# Patient Record
Sex: Female | Born: 1950 | Race: Black or African American | Hispanic: No | Smoking: Current every day smoker
Health system: Southern US, Community
[De-identification: ages and names within clinical notes are randomized; demographics above are authoritative.]

## PROBLEM LIST (undated history)

## (undated) DIAGNOSIS — M1990 Unspecified osteoarthritis, unspecified site: Secondary | ICD-10-CM

## (undated) DIAGNOSIS — R202 Paresthesia of skin: Secondary | ICD-10-CM

## (undated) DIAGNOSIS — I10 Essential (primary) hypertension: Secondary | ICD-10-CM

## (undated) HISTORY — PX: COLONOSCOPY: SHX174

## (undated) HISTORY — PX: BREAST SURGERY: SHX581

---

## 2001-07-08 ENCOUNTER — Encounter: Payer: Self-pay | Admitting: *Deleted

## 2001-07-08 ENCOUNTER — Other Ambulatory Visit: Admission: RE | Admit: 2001-07-08 | Discharge: 2001-07-08 | Payer: Self-pay | Admitting: *Deleted

## 2001-07-08 ENCOUNTER — Ambulatory Visit (HOSPITAL_COMMUNITY): Admission: RE | Admit: 2001-07-08 | Discharge: 2001-07-08 | Payer: Self-pay | Admitting: *Deleted

## 2001-07-09 ENCOUNTER — Ambulatory Visit (HOSPITAL_COMMUNITY): Admission: RE | Admit: 2001-07-09 | Discharge: 2001-07-09 | Payer: Self-pay | Admitting: *Deleted

## 2001-07-09 ENCOUNTER — Encounter: Payer: Self-pay | Admitting: *Deleted

## 2002-07-15 ENCOUNTER — Encounter: Payer: Self-pay | Admitting: Family Medicine

## 2002-07-15 ENCOUNTER — Ambulatory Visit (HOSPITAL_COMMUNITY): Admission: RE | Admit: 2002-07-15 | Discharge: 2002-07-15 | Payer: Self-pay | Admitting: Family Medicine

## 2003-08-01 ENCOUNTER — Ambulatory Visit (HOSPITAL_COMMUNITY): Admission: RE | Admit: 2003-08-01 | Discharge: 2003-08-01 | Payer: Self-pay | Admitting: Family Medicine

## 2003-08-01 ENCOUNTER — Encounter: Payer: Self-pay | Admitting: Family Medicine

## 2004-09-20 ENCOUNTER — Ambulatory Visit (HOSPITAL_COMMUNITY): Admission: RE | Admit: 2004-09-20 | Discharge: 2004-09-20 | Payer: Self-pay | Admitting: Family Medicine

## 2004-10-10 ENCOUNTER — Ambulatory Visit (HOSPITAL_COMMUNITY): Admission: RE | Admit: 2004-10-10 | Discharge: 2004-10-10 | Payer: Self-pay | Admitting: Family Medicine

## 2004-10-11 ENCOUNTER — Ambulatory Visit (HOSPITAL_COMMUNITY): Admission: RE | Admit: 2004-10-11 | Discharge: 2004-10-11 | Payer: Self-pay | Admitting: Family Medicine

## 2005-09-26 ENCOUNTER — Ambulatory Visit (HOSPITAL_COMMUNITY): Admission: RE | Admit: 2005-09-26 | Discharge: 2005-09-26 | Payer: Self-pay | Admitting: Family Medicine

## 2005-10-22 ENCOUNTER — Ambulatory Visit (HOSPITAL_COMMUNITY): Admission: RE | Admit: 2005-10-22 | Discharge: 2005-10-22 | Payer: Self-pay | Admitting: Family Medicine

## 2005-11-19 ENCOUNTER — Ambulatory Visit (HOSPITAL_COMMUNITY): Admission: RE | Admit: 2005-11-19 | Discharge: 2005-11-19 | Payer: Self-pay | Admitting: General Surgery

## 2005-11-19 ENCOUNTER — Encounter (INDEPENDENT_AMBULATORY_CARE_PROVIDER_SITE_OTHER): Payer: Self-pay | Admitting: General Surgery

## 2006-10-07 ENCOUNTER — Ambulatory Visit (HOSPITAL_COMMUNITY): Admission: RE | Admit: 2006-10-07 | Discharge: 2006-10-07 | Payer: Self-pay | Admitting: Family Medicine

## 2006-11-19 ENCOUNTER — Ambulatory Visit (HOSPITAL_COMMUNITY): Admission: RE | Admit: 2006-11-19 | Discharge: 2006-11-19 | Payer: Self-pay | Admitting: Family Medicine

## 2007-10-23 ENCOUNTER — Ambulatory Visit (HOSPITAL_COMMUNITY): Admission: RE | Admit: 2007-10-23 | Discharge: 2007-10-23 | Payer: Self-pay | Admitting: Family Medicine

## 2008-10-25 ENCOUNTER — Ambulatory Visit (HOSPITAL_COMMUNITY): Admission: RE | Admit: 2008-10-25 | Discharge: 2008-10-25 | Payer: Self-pay | Admitting: Family Medicine

## 2009-10-30 ENCOUNTER — Ambulatory Visit (HOSPITAL_COMMUNITY): Admission: RE | Admit: 2009-10-30 | Discharge: 2009-10-30 | Payer: Self-pay | Admitting: Family Medicine

## 2010-10-19 ENCOUNTER — Ambulatory Visit (HOSPITAL_COMMUNITY): Admission: RE | Admit: 2010-10-19 | Discharge: 2010-10-19 | Payer: Self-pay | Admitting: Family Medicine

## 2010-11-01 ENCOUNTER — Ambulatory Visit (HOSPITAL_COMMUNITY): Admission: RE | Admit: 2010-11-01 | Discharge: 2010-11-01 | Payer: Self-pay | Admitting: Family Medicine

## 2011-04-09 ENCOUNTER — Other Ambulatory Visit (HOSPITAL_COMMUNITY): Payer: Self-pay | Admitting: Family Medicine

## 2011-04-09 DIAGNOSIS — F17200 Nicotine dependence, unspecified, uncomplicated: Secondary | ICD-10-CM

## 2011-04-09 DIAGNOSIS — R350 Frequency of micturition: Secondary | ICD-10-CM

## 2011-04-09 DIAGNOSIS — R634 Abnormal weight loss: Secondary | ICD-10-CM

## 2011-04-10 ENCOUNTER — Ambulatory Visit (HOSPITAL_COMMUNITY)
Admission: RE | Admit: 2011-04-10 | Discharge: 2011-04-10 | Disposition: A | Discharge status: Home / Self Care | Payer: Medicare Other | Source: Ambulatory Visit | Attending: Family Medicine | Admitting: Family Medicine

## 2011-04-10 DIAGNOSIS — N9489 Other specified conditions associated with female genital organs and menstrual cycle: Secondary | ICD-10-CM | POA: Insufficient documentation

## 2011-04-10 DIAGNOSIS — R05 Cough: Secondary | ICD-10-CM | POA: Insufficient documentation

## 2011-04-10 DIAGNOSIS — M51379 Other intervertebral disc degeneration, lumbosacral region without mention of lumbar back pain or lower extremity pain: Secondary | ICD-10-CM | POA: Insufficient documentation

## 2011-04-10 DIAGNOSIS — N390 Urinary tract infection, site not specified: Secondary | ICD-10-CM | POA: Insufficient documentation

## 2011-04-10 DIAGNOSIS — R059 Cough, unspecified: Secondary | ICD-10-CM | POA: Insufficient documentation

## 2011-04-10 DIAGNOSIS — I700 Atherosclerosis of aorta: Secondary | ICD-10-CM | POA: Insufficient documentation

## 2011-04-10 DIAGNOSIS — R634 Abnormal weight loss: Secondary | ICD-10-CM | POA: Insufficient documentation

## 2011-04-10 DIAGNOSIS — F17200 Nicotine dependence, unspecified, uncomplicated: Secondary | ICD-10-CM | POA: Insufficient documentation

## 2011-04-10 DIAGNOSIS — K4090 Unilateral inguinal hernia, without obstruction or gangrene, not specified as recurrent: Secondary | ICD-10-CM | POA: Insufficient documentation

## 2011-04-10 DIAGNOSIS — M5137 Other intervertebral disc degeneration, lumbosacral region: Secondary | ICD-10-CM | POA: Insufficient documentation

## 2011-04-10 DIAGNOSIS — K5730 Diverticulosis of large intestine without perforation or abscess without bleeding: Secondary | ICD-10-CM | POA: Insufficient documentation

## 2011-04-10 DIAGNOSIS — R350 Frequency of micturition: Secondary | ICD-10-CM

## 2011-04-10 MED ORDER — IOHEXOL 300 MG/ML  SOLN
100.0000 mL | Freq: Once | INTRAMUSCULAR | Status: AC | PRN
  Administered 2011-04-10: 100 mL via INTRAVENOUS

## 2011-05-03 NOTE — H&P (Signed)
Isabel Woods, Isabel Woods                ACCOUNT NO.:  [id number]   MEDICAL RECORD NO.:  [id number]          PATIENT TYPE:  AMB   LOCATION:                                FACILITY:  [facility]   PHYSICIAN:  Dalia Heading, M.D.  DATE OF BIRTH:  1951-06-24   DATE OF ADMISSION:  11/19/2005  DATE OF DISCHARGE:  LH                                HISTORY & PHYSICAL   CHIEF COMPLAINT:  Need for screening colonoscopy.   HISTORY OF PRESENT ILLNESS:  The patient is a 60 year old black female who  is referred for endoscopic evaluation.  She needs a colonoscopy for  screening purposes.  No abdominal pain, weight loss, nausea, vomiting,  diarrhea, constipation, melena, hematochezia.  She has never had a  colonoscopy.  There is no family history of colon carcinoma.   PAST MEDICAL HISTORY:  1.  Arthritis.  2.  Hypertension.   PAST SURGICAL HISTORY:  Unremarkable.   CURRENT MEDICATIONS:  1.  Norvasc.  2.  Benicar.  3.  Metoprolol.  4.  Triamterene.  5.  Prednisone.  6.  Zyrtec.  7.  Remicade.   ALLERGIES:  Sulfa.   REVIEW OF SYSTEMS:  The patient smokes a pack of cigarettes a day.  She  denies any alcohol use.   PHYSICAL EXAMINATION:  GENERAL:  The patient is a well-developed, well-  nourished, black female in no acute distress.  LUNGS:  Clear to auscultation with equal breath sounds bilaterally.  HEART:  Regular rate and rhythm without S3, S4, or murmurs.  ABDOMEN:  Soft, nontender, nondistended.  No hepatosplenomegaly or masses  are noted.  RECTAL:  Deferred to the procedure.   IMPRESSION:  Need for screening colonoscopy.   PLAN:  The patient is scheduled for a colonoscopy on November 19, 2005.  The  risks and benefits of the procedure including bleeding and perforation were  fully explained to the patient who gave informed consent.      Dalia Heading, M.D.  Electronically Signed     MAJ/MEDQ  D:  10/31/2005  T:  10/31/2005  Job:  [id number]   cc:   Patrica Duel, M.D.  Fax:  [phone]

## 2011-10-09 ENCOUNTER — Other Ambulatory Visit (HOSPITAL_COMMUNITY): Payer: Self-pay | Admitting: Family Medicine

## 2011-10-09 DIAGNOSIS — Z139 Encounter for screening, unspecified: Secondary | ICD-10-CM

## 2011-10-29 ENCOUNTER — Other Ambulatory Visit (HOSPITAL_COMMUNITY): Payer: Self-pay | Admitting: Family Medicine

## 2011-10-29 ENCOUNTER — Ambulatory Visit (HOSPITAL_COMMUNITY)
Admission: RE | Admit: 2011-10-29 | Discharge: 2011-10-29 | Disposition: A | Discharge status: Home / Self Care | Payer: Medicare Other | Source: Ambulatory Visit | Attending: Family Medicine | Admitting: Family Medicine

## 2011-10-29 DIAGNOSIS — I10 Essential (primary) hypertension: Secondary | ICD-10-CM

## 2011-10-29 DIAGNOSIS — Z01419 Encounter for gynecological examination (general) (routine) without abnormal findings: Secondary | ICD-10-CM

## 2011-10-29 DIAGNOSIS — M069 Rheumatoid arthritis, unspecified: Secondary | ICD-10-CM | POA: Insufficient documentation

## 2011-11-04 ENCOUNTER — Ambulatory Visit (HOSPITAL_COMMUNITY)
Admission: RE | Admit: 2011-11-04 | Discharge: 2011-11-04 | Disposition: A | Discharge status: Home / Self Care | Payer: Medicare Other | Source: Ambulatory Visit | Attending: Family Medicine | Admitting: Family Medicine

## 2011-11-04 DIAGNOSIS — Z139 Encounter for screening, unspecified: Secondary | ICD-10-CM

## 2011-11-04 DIAGNOSIS — Z1231 Encounter for screening mammogram for malignant neoplasm of breast: Secondary | ICD-10-CM | POA: Insufficient documentation

## 2011-11-14 ENCOUNTER — Other Ambulatory Visit: Payer: Self-pay | Admitting: Family Medicine

## 2011-11-14 DIAGNOSIS — R928 Other abnormal and inconclusive findings on diagnostic imaging of breast: Secondary | ICD-10-CM

## 2011-12-04 ENCOUNTER — Other Ambulatory Visit: Payer: Self-pay | Admitting: Family Medicine

## 2011-12-04 ENCOUNTER — Ambulatory Visit (HOSPITAL_COMMUNITY)
Admission: RE | Admit: 2011-12-04 | Discharge: 2011-12-04 | Disposition: A | Discharge status: Home / Self Care | Payer: Medicare Other | Source: Ambulatory Visit | Attending: Family Medicine | Admitting: Family Medicine

## 2011-12-04 DIAGNOSIS — R928 Other abnormal and inconclusive findings on diagnostic imaging of breast: Secondary | ICD-10-CM | POA: Insufficient documentation

## 2011-12-04 DIAGNOSIS — R921 Mammographic calcification found on diagnostic imaging of breast: Secondary | ICD-10-CM

## 2011-12-16 ENCOUNTER — Other Ambulatory Visit: Payer: Self-pay | Admitting: Family Medicine

## 2011-12-16 ENCOUNTER — Ambulatory Visit
Admission: RE | Admit: 2011-12-16 | Discharge: 2011-12-16 | Disposition: A | Discharge status: Home / Self Care | Payer: Medicare Other | Source: Ambulatory Visit | Attending: Family Medicine | Admitting: Family Medicine

## 2011-12-16 DIAGNOSIS — R921 Mammographic calcification found on diagnostic imaging of breast: Secondary | ICD-10-CM

## 2011-12-18 ENCOUNTER — Ambulatory Visit
Admission: RE | Admit: 2011-12-18 | Discharge: 2011-12-18 | Disposition: A | Discharge status: Home / Self Care | Payer: Medicare Other | Source: Ambulatory Visit | Attending: Family Medicine | Admitting: Family Medicine

## 2011-12-18 DIAGNOSIS — R921 Mammographic calcification found on diagnostic imaging of breast: Secondary | ICD-10-CM

## 2012-01-21 ENCOUNTER — Other Ambulatory Visit (HOSPITAL_COMMUNITY): Payer: Self-pay | Admitting: Family Medicine

## 2012-01-21 ENCOUNTER — Ambulatory Visit (HOSPITAL_COMMUNITY)
Admission: RE | Admit: 2012-01-21 | Discharge: 2012-01-21 | Disposition: A | Discharge status: Home / Self Care | Payer: Medicare Other | Source: Ambulatory Visit | Attending: Family Medicine | Admitting: Family Medicine

## 2012-01-21 DIAGNOSIS — M5030 Other cervical disc degeneration, unspecified cervical region: Secondary | ICD-10-CM

## 2012-01-21 DIAGNOSIS — M47812 Spondylosis without myelopathy or radiculopathy, cervical region: Secondary | ICD-10-CM | POA: Insufficient documentation

## 2012-01-21 DIAGNOSIS — M542 Cervicalgia: Secondary | ICD-10-CM

## 2012-01-23 ENCOUNTER — Ambulatory Visit (HOSPITAL_COMMUNITY)
Admission: RE | Admit: 2012-01-23 | Discharge: 2012-01-23 | Disposition: A | Discharge status: Home / Self Care | Payer: Medicare Other | Source: Ambulatory Visit | Attending: Family Medicine | Admitting: Family Medicine

## 2012-01-23 DIAGNOSIS — M5020 Other cervical disc displacement, unspecified cervical region: Secondary | ICD-10-CM | POA: Insufficient documentation

## 2012-01-23 DIAGNOSIS — M542 Cervicalgia: Secondary | ICD-10-CM

## 2012-01-23 DIAGNOSIS — M5030 Other cervical disc degeneration, unspecified cervical region: Secondary | ICD-10-CM | POA: Insufficient documentation

## 2012-02-12 ENCOUNTER — Encounter (HOSPITAL_COMMUNITY): Payer: Self-pay | Admitting: Pharmacy Technician

## 2012-02-19 ENCOUNTER — Encounter (HOSPITAL_COMMUNITY)
Admission: RE | Admit: 2012-02-19 | Discharge: 2012-02-19 | Disposition: A | Discharge status: Home / Self Care | Payer: Medicare Other | Source: Ambulatory Visit | Attending: Anesthesiology | Admitting: Anesthesiology

## 2012-02-19 ENCOUNTER — Encounter (HOSPITAL_COMMUNITY)
Admission: RE | Admit: 2012-02-19 | Discharge: 2012-02-19 | Disposition: A | Discharge status: Home / Self Care | Payer: Medicare Other | Source: Ambulatory Visit | Attending: Neurosurgery | Admitting: Neurosurgery

## 2012-02-19 ENCOUNTER — Encounter (HOSPITAL_COMMUNITY): Payer: Self-pay

## 2012-02-19 ENCOUNTER — Other Ambulatory Visit: Payer: Self-pay

## 2012-02-19 HISTORY — DX: Paresthesia of skin: R20.2

## 2012-02-19 HISTORY — DX: Unspecified osteoarthritis, unspecified site: M19.90

## 2012-02-19 HISTORY — DX: Essential (primary) hypertension: I10

## 2012-02-19 LAB — SURGICAL PCR SCREEN
MRSA, PCR: NEGATIVE
Staphylococcus aureus: NEGATIVE

## 2012-02-19 LAB — BASIC METABOLIC PANEL
CO2: 26 mEq/L (ref 19–32)
Calcium: 9.1 mg/dL (ref 8.4–10.5)
Creatinine, Ser: 0.76 mg/dL (ref 0.50–1.10)
GFR calc non Af Amer: 89 mL/min — ABNORMAL LOW (ref >90)

## 2012-02-19 LAB — CBC
MCV: 87.1 fL (ref 78.0–100.0)
Platelets: 318 10*3/uL (ref 150–400)
RDW: 14.5 % (ref 11.5–15.5)
WBC: 9.9 10*3/uL (ref 4.0–10.5)

## 2012-02-19 NOTE — Pre-Procedure Instructions (Signed)
20 Isabel Woods  02/19/2012   Your procedure is scheduled on:  MARCH 12  Report to [facility] at 0800 AM.  Call this number if you have problems the morning of surgery: [phone]   Remember:   Do not eat food:After Midnight.  May have clear liquids: up to 4 Hours before arrival.0400  Clear liquids include soda, tea, black coffee, apple or grape juice, broth.  Take these medicines the morning of surgery with A SIP OF WATER: METOPROLOL   Do not wear jewelry, make-up or nail polish.  Do not wear lotions, powders, or perfumes. You may wear deodorant.  Do not shave 48 hours prior to surgery.  Do not bring valuables to the hospital.  Contacts, dentures or bridgework may not be worn into surgery.  Leave suitcase in the car. After surgery it may be brought to your room.  For patients admitted to the hospital, checkout time is 11:00 AM the day of discharge.   Patients discharged the day of surgery will not be allowed to drive home.  Name and phone number of your driver: FAMILY  Special Instructions: CHG Shower Use Special Wash: 1/2 bottle night before surgery and 1/2 bottle morning of surgery.   Please read over the following fact sheets that you were given: MRSA Information and Surgical Site Infection Prevention

## 2012-02-19 NOTE — Progress Notes (Signed)
CALLED FOR ORDERS 

## 2012-02-20 NOTE — Progress Notes (Signed)
Office notified that no orders have been received at this time.//L. Nicosha Struve,RN

## 2012-02-25 ENCOUNTER — Ambulatory Visit (HOSPITAL_COMMUNITY): Payer: Medicare Other | Admitting: Anesthesiology

## 2012-02-25 ENCOUNTER — Encounter (HOSPITAL_COMMUNITY): Payer: Self-pay | Admitting: Surgery

## 2012-02-25 ENCOUNTER — Encounter (HOSPITAL_COMMUNITY)
Admission: RE | Disposition: A | Discharge status: Home / Self Care | Payer: Self-pay | Source: Ambulatory Visit | Attending: Neurosurgery

## 2012-02-25 ENCOUNTER — Encounter (HOSPITAL_COMMUNITY): Payer: Self-pay | Admitting: Anesthesiology

## 2012-02-25 ENCOUNTER — Ambulatory Visit (HOSPITAL_COMMUNITY): Payer: Medicare Other

## 2012-02-25 ENCOUNTER — Inpatient Hospital Stay (HOSPITAL_COMMUNITY)
Admission: RE | Admit: 2012-02-25 | Discharge: 2012-02-25 | DRG: 473 | Disposition: A | Discharge status: Home / Self Care | Payer: Medicare Other | Source: Ambulatory Visit | Attending: Neurosurgery | Admitting: Neurosurgery

## 2012-02-25 DIAGNOSIS — Z01812 Encounter for preprocedural laboratory examination: Secondary | ICD-10-CM

## 2012-02-25 DIAGNOSIS — M5020 Other cervical disc displacement, unspecified cervical region: Secondary | ICD-10-CM

## 2012-02-25 DIAGNOSIS — M069 Rheumatoid arthritis, unspecified: Secondary | ICD-10-CM | POA: Diagnosis present

## 2012-02-25 DIAGNOSIS — I10 Essential (primary) hypertension: Secondary | ICD-10-CM | POA: Diagnosis present

## 2012-02-25 DIAGNOSIS — F17200 Nicotine dependence, unspecified, uncomplicated: Secondary | ICD-10-CM | POA: Diagnosis present

## 2012-02-25 HISTORY — PX: ANTERIOR CERVICAL DECOMP/DISCECTOMY FUSION: SHX1161

## 2012-02-25 SURGERY — ANTERIOR CERVICAL DECOMPRESSION/DISCECTOMY FUSION 2 LEVELS
Anesthesia: General | Wound class: Clean

## 2012-02-25 MED ORDER — LACTATED RINGERS IV SOLN
INTRAVENOUS | Status: DC | PRN
  Administered 2012-02-25 (×2): via INTRAVENOUS

## 2012-02-25 MED ORDER — AMLODIPINE BESYLATE 10 MG PO TABS
10.0000 mg | ORAL_TABLET | Freq: Every day | ORAL | Status: DC
  Filled 2012-02-25: qty 1

## 2012-02-25 MED ORDER — LEFLUNOMIDE 20 MG PO TABS
20.0000 mg | ORAL_TABLET | Freq: Every day | ORAL | Status: DC
  Filled 2012-02-25: qty 1

## 2012-02-25 MED ORDER — LOSARTAN POTASSIUM 50 MG PO TABS
100.0000 mg | ORAL_TABLET | Freq: Every day | ORAL | Status: DC
  Filled 2012-02-25: qty 2

## 2012-02-25 MED ORDER — HYDROCODONE-ACETAMINOPHEN 5-325 MG PO TABS: 1.0000 | ORAL_TABLET | ORAL | Status: AC | PRN

## 2012-02-25 MED ORDER — HYDROCHLOROTHIAZIDE 12.5 MG PO CAPS
12.5000 mg | ORAL_CAPSULE | Freq: Every day | ORAL | Status: DC
  Filled 2012-02-25: qty 1

## 2012-02-25 MED ORDER — ONDANSETRON HCL 4 MG/2ML IJ SOLN: 4.0000 mg | Freq: Once | INTRAMUSCULAR | Status: DC | PRN

## 2012-02-25 MED ORDER — THROMBIN 5000 UNITS EX SOLR
CUTANEOUS | Status: DC | PRN
  Administered 2012-02-25 (×2): 5000 [IU] via TOPICAL

## 2012-02-25 MED ORDER — HYDROCODONE-ACETAMINOPHEN 5-325 MG PO TABS: 1.0000 | ORAL_TABLET | ORAL | Status: DC | PRN

## 2012-02-25 MED ORDER — HYDROMORPHONE HCL PF 1 MG/ML IJ SOLN
0.2500 mg | INTRAMUSCULAR | Status: DC | PRN
  Administered 2012-02-25 (×2): 0.5 mg via INTRAVENOUS

## 2012-02-25 MED ORDER — HEMOSTATIC AGENTS (NO CHARGE) OPTIME
TOPICAL | Status: DC | PRN
  Administered 2012-02-25: 1 via TOPICAL

## 2012-02-25 MED ORDER — SODIUM CHLORIDE 0.9 % IR SOLN
Status: DC | PRN
  Administered 2012-02-25: 08:00:00

## 2012-02-25 MED ORDER — DIPHENHYDRAMINE HCL 50 MG/ML IJ SOLN
50.0000 mg | Freq: Once | INTRAMUSCULAR | Status: DC
  Filled 2012-02-25: qty 1

## 2012-02-25 MED ORDER — GLYCOPYRROLATE 0.2 MG/ML IJ SOLN
INTRAMUSCULAR | Status: DC | PRN
  Administered 2012-02-25: .8 mg via INTRAVENOUS

## 2012-02-25 MED ORDER — KCL IN DEXTROSE-NACL 20-5-0.45 MEQ/L-%-% IV SOLN
80.0000 mL/h | INTRAVENOUS | Status: DC
  Filled 2012-02-25 (×2): qty 1000

## 2012-02-25 MED ORDER — HYDROMORPHONE HCL PF 1 MG/ML IJ SOLN
INTRAMUSCULAR | Status: AC
  Filled 2012-02-25: qty 1

## 2012-02-25 MED ORDER — MENTHOL 3 MG MT LOZG: 1.0000 | LOZENGE | OROMUCOSAL | Status: DC | PRN

## 2012-02-25 MED ORDER — HYDROMORPHONE HCL PF 1 MG/ML IJ SOLN: 1.0000 mg | INTRAMUSCULAR | Status: DC | PRN

## 2012-02-25 MED ORDER — BACITRACIN 50000 UNITS IM SOLR
INTRAMUSCULAR | Status: AC
  Filled 2012-02-25: qty 1

## 2012-02-25 MED ORDER — LORATADINE 10 MG PO TABS: 10.0000 mg | ORAL_TABLET | Freq: Every day | ORAL | Status: DC

## 2012-02-25 MED ORDER — CYCLOBENZAPRINE HCL 10 MG PO TABS: 10.0000 mg | ORAL_TABLET | Freq: Three times a day (TID) | ORAL | Status: DC | PRN

## 2012-02-25 MED ORDER — SODIUM CHLORIDE 0.9 % IV SOLN
INTRAVENOUS | Status: AC
  Filled 2012-02-25: qty 500

## 2012-02-25 MED ORDER — ONDANSETRON HCL 4 MG/2ML IJ SOLN
INTRAMUSCULAR | Status: DC | PRN
  Administered 2012-02-25: 4 mg via INTRAVENOUS

## 2012-02-25 MED ORDER — 0.9 % SODIUM CHLORIDE (POUR BTL) OPTIME
TOPICAL | Status: DC | PRN
  Administered 2012-02-25: 1000 mL

## 2012-02-25 MED ORDER — PHENOL 1.4 % MT LIQD
1.0000 | OROMUCOSAL | Status: DC | PRN
  Filled 2012-02-25: qty 177

## 2012-02-25 MED ORDER — DEXAMETHASONE SODIUM PHOSPHATE 10 MG/ML IJ SOLN
10.0000 mg | Freq: Once | INTRAMUSCULAR | Status: AC
  Administered 2012-02-25: 10 mg via INTRAVENOUS
  Filled 2012-02-25: qty 1

## 2012-02-25 MED ORDER — METOPROLOL TARTRATE 100 MG PO TABS
100.0000 mg | ORAL_TABLET | Freq: Two times a day (BID) | ORAL | Status: DC
  Filled 2012-02-25: qty 1

## 2012-02-25 MED ORDER — ACETAMINOPHEN 650 MG RE SUPP: 650.0000 mg | RECTAL | Status: DC | PRN

## 2012-02-25 MED ORDER — MIDAZOLAM HCL 5 MG/5ML IJ SOLN
INTRAMUSCULAR | Status: DC | PRN
  Administered 2012-02-25: 2 mg via INTRAVENOUS

## 2012-02-25 MED ORDER — LIDOCAINE HCL 4 % MT SOLN
OROMUCOSAL | Status: DC | PRN
  Administered 2012-02-25: 4 mL via TOPICAL

## 2012-02-25 MED ORDER — TOBRAMYCIN SULFATE 80 MG/2ML IJ SOLN
80.0000 mg | INTRAVENOUS | Status: AC
  Administered 2012-02-25: 80 mg via INTRAVENOUS
  Filled 2012-02-25: qty 2

## 2012-02-25 MED ORDER — SODIUM CHLORIDE 0.9 % IJ SOLN: 3.0000 mL | Freq: Two times a day (BID) | INTRAMUSCULAR | Status: DC

## 2012-02-25 MED ORDER — NEOSTIGMINE METHYLSULFATE 1 MG/ML IJ SOLN
INTRAMUSCULAR | Status: DC | PRN
  Administered 2012-02-25: 4 mg via INTRAVENOUS

## 2012-02-25 MED ORDER — EPHEDRINE SULFATE 50 MG/ML IJ SOLN
INTRAMUSCULAR | Status: DC | PRN
  Administered 2012-02-25 (×3): 10 mg via INTRAVENOUS

## 2012-02-25 MED ORDER — VANCOMYCIN HCL 500 MG IV SOLR
500.0000 mg | INTRAVENOUS | Status: AC
  Administered 2012-02-25: 500 mg via INTRAVENOUS
  Filled 2012-02-25: qty 500

## 2012-02-25 MED ORDER — ACETAMINOPHEN 325 MG PO TABS: 650.0000 mg | ORAL_TABLET | ORAL | Status: DC | PRN

## 2012-02-25 MED ORDER — LIDOCAINE HCL (CARDIAC) 20 MG/ML IV SOLN
INTRAVENOUS | Status: DC | PRN
  Administered 2012-02-25: 60 mg via INTRAVENOUS

## 2012-02-25 MED ORDER — FENTANYL CITRATE 0.05 MG/ML IJ SOLN
INTRAMUSCULAR | Status: DC | PRN
  Administered 2012-02-25: 150 ug via INTRAVENOUS

## 2012-02-25 MED ORDER — MONTELUKAST SODIUM 10 MG PO TABS
10.0000 mg | ORAL_TABLET | Freq: Every day | ORAL | Status: DC
  Filled 2012-02-25: qty 1

## 2012-02-25 MED ORDER — ONDANSETRON HCL 4 MG/2ML IJ SOLN: 4.0000 mg | INTRAMUSCULAR | Status: DC | PRN

## 2012-02-25 MED ORDER — SODIUM CHLORIDE 0.9 % IJ SOLN: 3.0000 mL | INTRAMUSCULAR | Status: DC | PRN

## 2012-02-25 MED ORDER — PROPOFOL 10 MG/ML IV EMUL
INTRAVENOUS | Status: DC | PRN
  Administered 2012-02-25: 200 mg via INTRAVENOUS

## 2012-02-25 MED ORDER — ROCURONIUM BROMIDE 100 MG/10ML IV SOLN
INTRAVENOUS | Status: DC | PRN
  Administered 2012-02-25: 10 mg via INTRAVENOUS
  Administered 2012-02-25: 40 mg via INTRAVENOUS

## 2012-02-25 SURGICAL SUPPLY — 56 items
APL SKNCLS STERI-STRIP NONHPOA (GAUZE/BANDAGES/DRESSINGS) ×1
BAG DECANTER FOR FLEXI CONT (MISCELLANEOUS) ×2 IMPLANT
BENZOIN TINCTURE PRP APPL 2/3 (GAUZE/BANDAGES/DRESSINGS) ×2 IMPLANT
BIT DRILL TRINICA 2.3MM (BIT) IMPLANT
BRUSH SCRUB EZ PLAIN DRY (MISCELLANEOUS) ×2 IMPLANT
CANISTER SUCTION 2500CC (MISCELLANEOUS) ×2 IMPLANT
CLOTH BEACON ORANGE TIMEOUT ST (SAFETY) ×2 IMPLANT
CONT SPEC 4OZ CLIKSEAL STRL BL (MISCELLANEOUS) ×2 IMPLANT
DRAPE C-ARM 42X72 X-RAY (DRAPES) ×4 IMPLANT
DRAPE LAPAROTOMY 100X72 PEDS (DRAPES) ×2 IMPLANT
DRAPE MICROSCOPE ZEISS OPMI (DRAPES) ×2 IMPLANT
DRAPE SURG 17X23 STRL (DRAPES) ×4 IMPLANT
DRESSING TELFA 8X3 (GAUZE/BANDAGES/DRESSINGS) ×2 IMPLANT
DRILL BIT TRINICA 2.3MM (BIT)
ELECT COATED BLADE 2.86 ST (ELECTRODE) ×2 IMPLANT
ELECT REM PT RETURN 9FT ADLT (ELECTROSURGICAL) ×2
ELECTRODE REM PT RTRN 9FT ADLT (ELECTROSURGICAL) ×1 IMPLANT
GAUZE SPONGE 4X4 16PLY XRAY LF (GAUZE/BANDAGES/DRESSINGS) ×2 IMPLANT
GLOVE BIOGEL PI IND STRL 8 (GLOVE) ×1 IMPLANT
GLOVE BIOGEL PI IND STRL 8.5 (GLOVE) ×1 IMPLANT
GLOVE BIOGEL PI INDICATOR 8 (GLOVE) ×1
GLOVE BIOGEL PI INDICATOR 8.5 (GLOVE) ×1
GLOVE ECLIPSE 7.5 STRL STRAW (GLOVE) ×6 IMPLANT
GLOVE ECLIPSE 8.5 STRL (GLOVE) ×2 IMPLANT
GLOVE EXAM NITRILE LRG STRL (GLOVE) IMPLANT
GLOVE EXAM NITRILE MD LF STRL (GLOVE) ×2 IMPLANT
GLOVE EXAM NITRILE XL STR (GLOVE) IMPLANT
GLOVE EXAM NITRILE XS STR PU (GLOVE) IMPLANT
GOWN BRE IMP SLV AUR LG STRL (GOWN DISPOSABLE) IMPLANT
GOWN BRE IMP SLV AUR XL STRL (GOWN DISPOSABLE) ×2 IMPLANT
GOWN STRL REIN 2XL LVL4 (GOWN DISPOSABLE) ×4 IMPLANT
HEAD HALTER (SOFTGOODS) ×2 IMPLANT
INTERBODY TM 11X14X6-7DEG ANG (Metal Cage) ×2 IMPLANT
KIT BASIN OR (CUSTOM PROCEDURE TRAY) ×2 IMPLANT
KIT ROOM TURNOVER OR (KITS) ×2 IMPLANT
NEEDLE SPNL 20GX3.5 QUINCKE YW (NEEDLE) ×2 IMPLANT
NS IRRIG 1000ML POUR BTL (IV SOLUTION) ×2 IMPLANT
PACK LAMINECTOMY NEURO (CUSTOM PROCEDURE TRAY) ×2 IMPLANT
PAD ARMBOARD 7.5X6 YLW CONV (MISCELLANEOUS) ×2 IMPLANT
PAD TELFA 3X4 1S STER (GAUZE/BANDAGES/DRESSINGS) ×2 IMPLANT
PATTIES SURGICAL .75X.75 (GAUZE/BANDAGES/DRESSINGS) ×2 IMPLANT
PUTTY BONE GRAFT KIT 2.5ML (Bone Implant) ×2 IMPLANT
RUBBERBAND STERILE (MISCELLANEOUS) ×4 IMPLANT
SPACER TMS 11X14X6MM (Spacer) ×2 IMPLANT
SPONGE GAUZE 4X4 12PLY (GAUZE/BANDAGES/DRESSINGS) IMPLANT
SPONGE INTESTINAL PEANUT (DISPOSABLE) ×2 IMPLANT
SPONGE SURGIFOAM ABS GEL SZ50 (HEMOSTASIS) ×2 IMPLANT
STRIP CLOSURE SKIN 1/2X4 (GAUZE/BANDAGES/DRESSINGS) IMPLANT
SUT PDS AB 5-0 P3 18 (SUTURE) ×2 IMPLANT
SUT VIC AB 3-0 CP2 18 (SUTURE) ×4 IMPLANT
SYR 20ML ECCENTRIC (SYRINGE) IMPLANT
TOOL MATCHSTK 3MM (MISCELLANEOUS) ×2 IMPLANT
TOWEL OR 17X24 6PK STRL BLUE (TOWEL DISPOSABLE) ×2 IMPLANT
TOWEL OR 17X26 10 PK STRL BLUE (TOWEL DISPOSABLE) ×2 IMPLANT
TRAP SPECIMEN MUCOUS 40CC (MISCELLANEOUS) IMPLANT
WATER STERILE IRR 1000ML POUR (IV SOLUTION) ×2 IMPLANT

## 2012-02-25 NOTE — Anesthesia Postprocedure Evaluation (Signed)
  Anesthesia Post-op Note  Patient: Isabel Woods  Procedure(s) Performed: Procedure(s) (LRB): ANTERIOR CERVICAL DECOMPRESSION/DISCECTOMY FUSION 2 LEVELS (N/A)  Patient Location: PACU  Anesthesia Type: General  Level of Consciousness: awake, alert , oriented and patient cooperative  Airway and Oxygen Therapy: Patient Spontanous Breathing and Patient connected to nasal cannula oxygen  Post-op Pain: mild  Post-op Assessment: Post-op Vital signs reviewed, Patient's Cardiovascular Status Stable, Respiratory Function Stable, Patent Airway, No signs of Nausea or vomiting and Pain level controlled  Post-op Vital Signs: stable  Complications: No apparent anesthesia complications

## 2012-02-25 NOTE — Transfer of Care (Signed)
Immediate Anesthesia Transfer of Care Note  Patient: Isabel Woods  Procedure(s) Performed: Procedure(s) (LRB): ANTERIOR CERVICAL DECOMPRESSION/DISCECTOMY FUSION 2 LEVELS (N/A)  Patient Location: PACU  Anesthesia Type: General  Level of Consciousness: awake, alert , oriented and patient cooperative  Airway & Oxygen Therapy: Patient Spontanous Breathing and Patient connected to nasal cannula oxygen  Post-op Assessment: Report given to PACU RN, Post -op Vital signs reviewed and stable and Patient moving all extremities X 4  Post vital signs: Reviewed and stable  Complications: No apparent anesthesia complications

## 2012-02-25 NOTE — Anesthesia Procedure Notes (Signed)
Procedure Name: Intubation Date/Time: 02/25/2012 7:51 AM Performed by: Rogelia Boga Pre-anesthesia Checklist: Patient identified, Emergency Drugs available, Suction available, Patient being monitored and Timeout performed Patient Re-evaluated:Patient Re-evaluated prior to inductionOxygen Delivery Method: Circle system utilized Preoxygenation: Pre-oxygenation with 100% oxygen Intubation Type: IV induction Ventilation: Mask ventilation without difficulty Laryngoscope Size: Mac and 4 Grade View: Grade I Tube type: Oral Tube size: 7.5 mm Number of attempts: 1 Airway Equipment and Method: Stylet Placement Confirmation: ETT inserted through vocal cords under direct vision,  positive ETCO2 and breath sounds checked- equal and bilateral Secured at: 22 cm Tube secured with: Tape Dental Injury: Teeth and Oropharynx as per pre-operative assessment

## 2012-02-25 NOTE — Preoperative (Signed)
Beta Blockers   Reason not to administer Beta Blockers:Not Applicable 

## 2012-02-25 NOTE — Discharge Summary (Signed)
  Surgery this morning, home now. Pain totally relieved. No neuro issues. Dressing clean and dry. Home now, specific instructions given.

## 2012-02-25 NOTE — Op Note (Signed)
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 Localizing fluoroscopy was used prior to incision to identify the appropriate level. Transverse incision was made in the right anterior neck started the midline and headed towards the medial aspect of the sternocleidomastoid muscle. The platysma muscle was incised transversely and the natural fascial plane between the strap muscles medially and the sternocleidomastoid laterally was identified and followed down to the anterior aspect the cervical spine. Longus coli muscles were identified split in the midline and Triple-A bilaterally with unipolar coagulation and Barista. Self retaining retractor was placed for exposure and x-ray showed approach the appropriate levels. Using a 15 blade the annulus was incised at C5-6 and C6-7. Using pituitary rongeurs and curettes approximately 90% of the disc material was removed. High-speed drill was used to widen the interspace and bony shavings were saved for use later in the case. The microscope was draped brought into the field and used for the remainder of the case. Using microdissection technique the remainder of the disc material down to the posterior longitudinal ligament was removed. The remainder of the disc at C5-6 was then removed and the ligament removed in a piecemeal fashion. Thorough decompression was carried out of the spinal dura and foramen bilaterally until the nerve roots were visualized. This was done and again at C6-7 were thorough spinal decompression and nerve root decompression was carried out. At this time inspection was carried out bilaterally at both  levels for any evidence of residual compression and none could be identified. Large amounts of irrigation were carried out any bleeding control proper coagulation Gelfoam. Measurements were taken and to 6 mm lordotic graft were chosen. They were filled with a mixture of autologous bone and morselized allograft and then impacted into the disc space without difficulty. Fluoroscopy showed good position of the plugs. An appropriate length Trinica anterior cervical plate was then chosen. Under fluoroscopic guidance drill holes were placed followed by placing 12 mm screws x6. Locking mechanism was then rotated into the locked position and fluoroscopy showed good position of the plate and screws. Irrigation was carried out any bleeding control proper coagulation. The wounds and closed with inverted Vicryl on the platysma muscle inverted 5-0 PDS in the subcuticular layer and Steri-Strips on the skin. A sterile dressing a soft collar applied and the patient was extubated and taken to recovery in stable condition.

## 2012-02-25 NOTE — Anesthesia Preprocedure Evaluation (Addendum)
Anesthesia Evaluation  Patient identified by MRN, date of birth, ID band Patient awake    Reviewed: Allergy & Precautions, H&P , NPO status   Airway Mallampati: II TM Distance: >3 FB Neck ROM: Full    Dental  (+) Dental Advisory Given   Pulmonary Current Smoker,          Cardiovascular hypertension, Pt. on medications and Pt. on home beta blockers     Neuro/Psych    GI/Hepatic   Endo/Other    Renal/GU      Musculoskeletal  (+) Arthritis -, Osteoarthritis,    Abdominal   Peds  Hematology   Anesthesia Other Findings   Reproductive/Obstetrics                          Anesthesia Physical Anesthesia Plan  ASA: II  Anesthesia Plan: General   Post-op Pain Management:    Induction:   Airway Management Planned: Oral ETT  Additional Equipment:   Intra-op Plan:   Post-operative Plan: Extubation in OR  Informed Consent: I have reviewed the patients History and Physical, chart, labs and discussed the procedure including the risks, benefits and alternatives for the proposed anesthesia with the patient or authorized representative who has indicated his/her understanding and acceptance.   Dental advisory given  Plan Discussed with: Anesthesiologist and CRNA  Anesthesia Plan Comments:         Anesthesia Quick Evaluation

## 2012-02-25 NOTE — Brief Op Note (Signed)
02/25/2012  10:06 AM  PATIENT:  Isabel Woods  61 y.o. female  PRE-OPERATIVE DIAGNOSIS:  Herniated Nucleus Pulposus  POST-OPERATIVE DIAGNOSIS:  Herniated Nucleus Pulposus  PROCEDURE:  Procedure(s) (LRB): ANTERIOR CERVICAL DECOMPRESSION/DISCECTOMY FUSION 2 LEVELS (N/A)  SURGEON:  Surgeon(s) and Role:    * Reinaldo Meeker, MD - Primary    * Barnett Abu, MD - Assisting  PHYSICIAN ASSISTANT:   ASSISTANTS: Elsner   ANESTHESIA:   general  EBL:  Total I/O In: 1000 [I.V.:1000] Out: 25 [Blood:25]  BLOOD ADMINISTERED:none  DRAINS: none   LOCAL MEDICATIONS USED:  NONE  SPECIMEN:  No Specimen  DISPOSITION OF SPECIMEN:  N/A  COUNTS:  YES  TOURNIQUET:  * No tourniquets in log *  DICTATION: .Dragon Dictation  PLAN OF CARE: Admit to inpatient   PATIENT DISPOSITION:  PACU - hemodynamically stable.   Delay start of Pharmacological VTE agent (>24hrs) due to surgical blood loss or risk of bleeding: yes

## 2012-02-25 NOTE — H&P (Signed)
  Isabel Woods is an 61 y.o. female.   Chief Complaint: Left upper extremity pain HPI: The patient is a 61 year old female who presented with left upper extremity pain. She was tried a right conservative therapies without improvement. She underwent MRI scanning which showed disc abnormalities at C5-6 and C6-7 on the left which fit well with her clinical presentation. After discussing the options the patient requested surgery and is now admitted for a two-level anterior cervical discectomy. I had a long discussion with her regarding the risks and benefits of surgical intervention. The risks discussed include but are not limited to bleeding infection weakness numbness paralysis spinal fluid leak hoarseness coma and death. We have discussed alternative methods of therapy along with the risks and benefits of nonintervention. She has had the opportunity to rest numerous questions and appears to understand. With this information in hand she has requested that we proceed with surgery.  Past Medical History  Diagnosis Date  . Hypertension   . Arthritis     RA  . Paresthesia of arm     Past Surgical History  Procedure Date  . Breast surgery     CYST L     History reviewed. No pertinent family history. Social History:  reports that she has been smoking Cigarettes.  She has a 7.5 pack-year smoking history. She does not have any smokeless tobacco history on file. She reports that she does not drink alcohol or use illicit drugs.  Allergies:  Allergies  Allergen Reactions  . Sulfa Antibiotics Anaphylaxis    Medications Prior to Admission  Medication Dose Route Frequency Provider Last Rate Last Dose  . bacitracin 16109 UNITS injection           . dexamethasone (DECADRON) injection 10 mg  10 mg Intravenous Once Reinaldo Meeker, MD      . diphenhydrAMINE (BENADRYL) injection 50 mg  50 mg Intravenous Once Reinaldo Meeker, MD      . sodium chloride 0.9 % infusion           . tobramycin (NEBCIN) 80 mg  in dextrose 5 % 50 mL IVPB  80 mg Intravenous To 282 Reinaldo Meeker, MD      . vancomycin (VANCOCIN) 500 mg in sodium chloride 0.9 % 100 mL IVPB  500 mg Intravenous To 282 Reinaldo Meeker, MD       No current outpatient prescriptions on file as of 02/25/2012.    No results found for this or any previous visit (from the past 48 hour(s)). No results found.  A comprehensive review of systems was negative.  Blood pressure 123/86, pulse 60, temperature 97.6 F (36.4 C), temperature source Oral, resp. rate 18.  The patient is awake alert and oriented. Her strength is mild decreased tricep muscle on the left. Her sensation is decreased in the thumb index and middle fingers her left hand. Assessment/Plan Impression is that of a C6 and C7 radiculopathy. Plan is for a two-level anterior cervical discectomy fusion and plating.  Reinaldo Meeker, MD 02/25/2012, 7:39 AM

## 2012-03-03 ENCOUNTER — Encounter (HOSPITAL_COMMUNITY): Payer: Self-pay | Admitting: Neurosurgery

## 2012-10-29 ENCOUNTER — Other Ambulatory Visit (HOSPITAL_COMMUNITY): Payer: Self-pay | Admitting: Family Medicine

## 2012-10-29 DIAGNOSIS — Z139 Encounter for screening, unspecified: Secondary | ICD-10-CM

## 2012-11-19 ENCOUNTER — Ambulatory Visit (HOSPITAL_COMMUNITY)
Admission: RE | Admit: 2012-11-19 | Discharge: 2012-11-19 | Disposition: A | Discharge status: Home / Self Care | Payer: Medicare Other | Source: Ambulatory Visit | Attending: Family Medicine | Admitting: Family Medicine

## 2012-11-19 ENCOUNTER — Other Ambulatory Visit (HOSPITAL_COMMUNITY): Payer: Self-pay | Admitting: Family Medicine

## 2012-11-19 DIAGNOSIS — E785 Hyperlipidemia, unspecified: Secondary | ICD-10-CM | POA: Insufficient documentation

## 2012-11-19 DIAGNOSIS — F17200 Nicotine dependence, unspecified, uncomplicated: Secondary | ICD-10-CM | POA: Insufficient documentation

## 2012-11-19 DIAGNOSIS — I10 Essential (primary) hypertension: Secondary | ICD-10-CM | POA: Insufficient documentation

## 2012-12-07 ENCOUNTER — Ambulatory Visit (HOSPITAL_COMMUNITY): Payer: Medicare Other

## 2012-12-10 ENCOUNTER — Ambulatory Visit (HOSPITAL_COMMUNITY)
Admission: RE | Admit: 2012-12-10 | Discharge: 2012-12-10 | Disposition: A | Discharge status: Home / Self Care | Payer: Medicare Other | Source: Ambulatory Visit | Attending: Family Medicine | Admitting: Family Medicine

## 2012-12-10 DIAGNOSIS — Z1231 Encounter for screening mammogram for malignant neoplasm of breast: Secondary | ICD-10-CM | POA: Insufficient documentation

## 2012-12-10 DIAGNOSIS — Z139 Encounter for screening, unspecified: Secondary | ICD-10-CM

## 2013-03-13 IMAGING — CR DG CHEST 2V
2 series · 2 of 2 positions shown · non-contrast
Comparison: 04/10/2011

CLINICAL DATA: Essential hypertension and rheumatoid arthritis

CHEST - 2 VIEW

[view not recorded (1 of 2)]
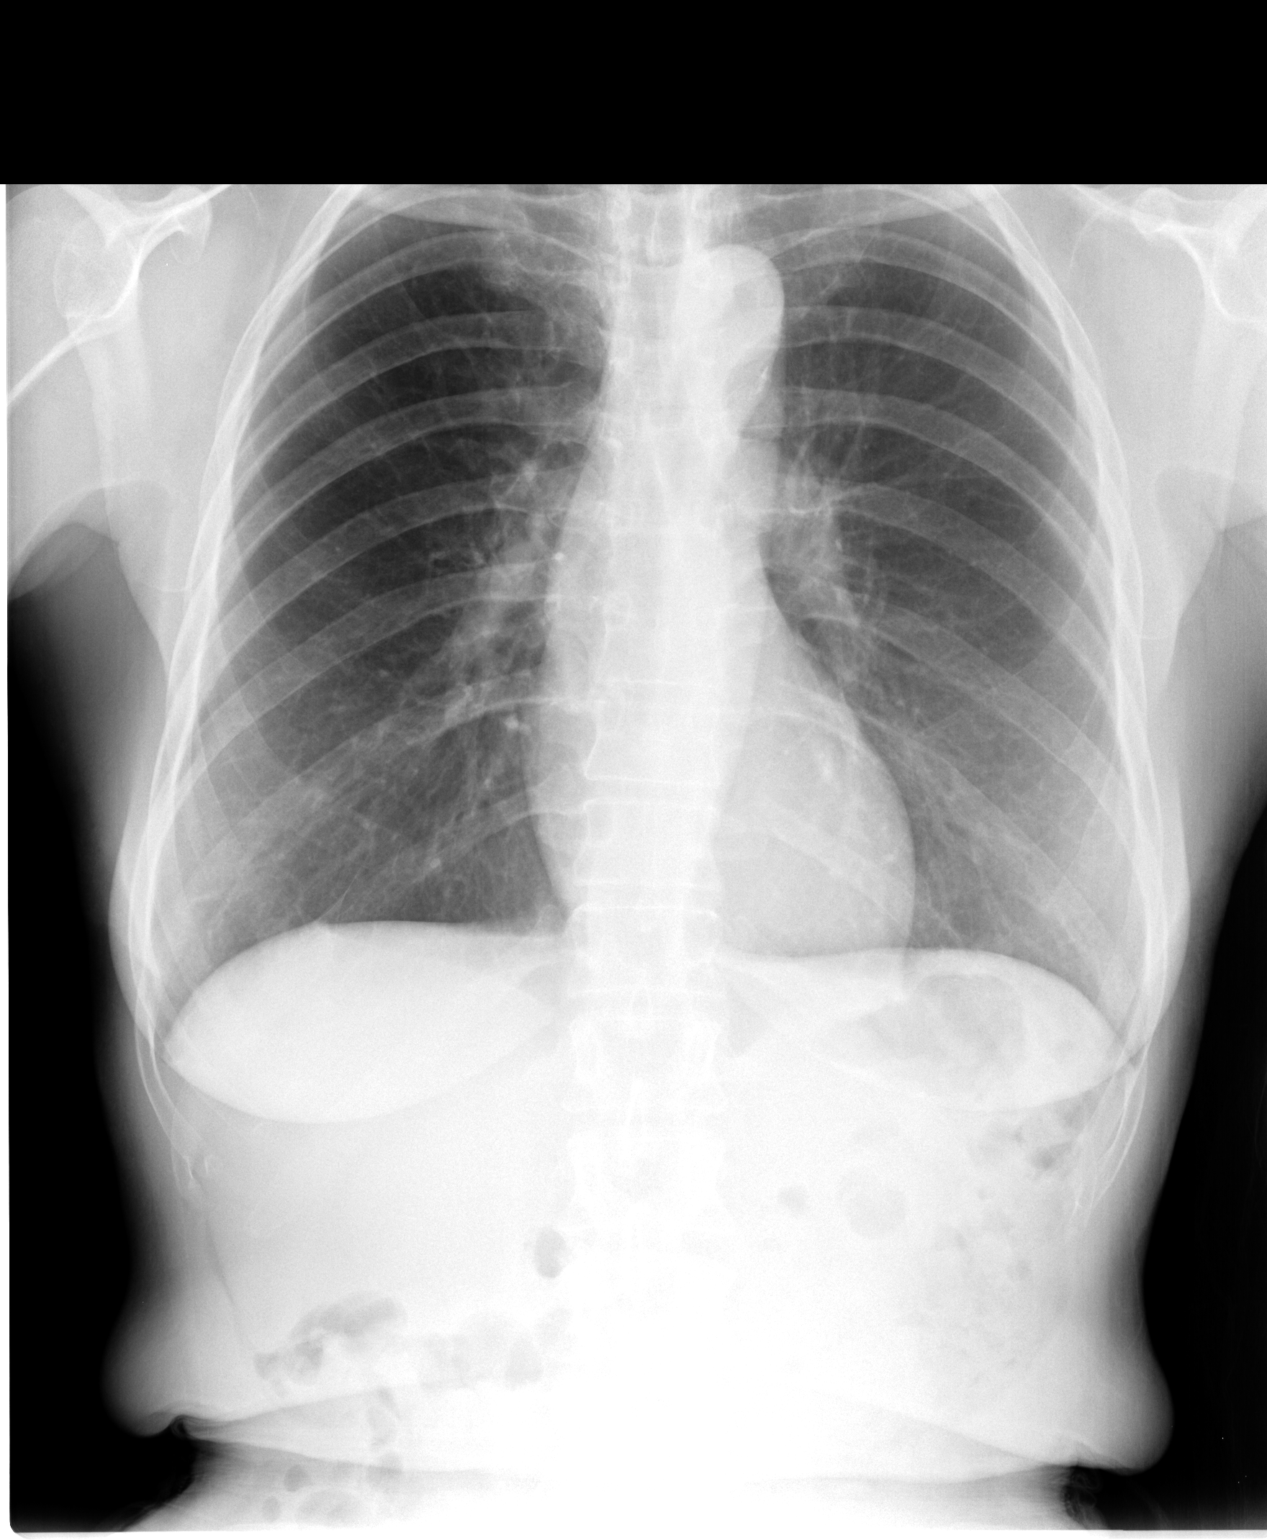

[view not recorded (2 of 2)]
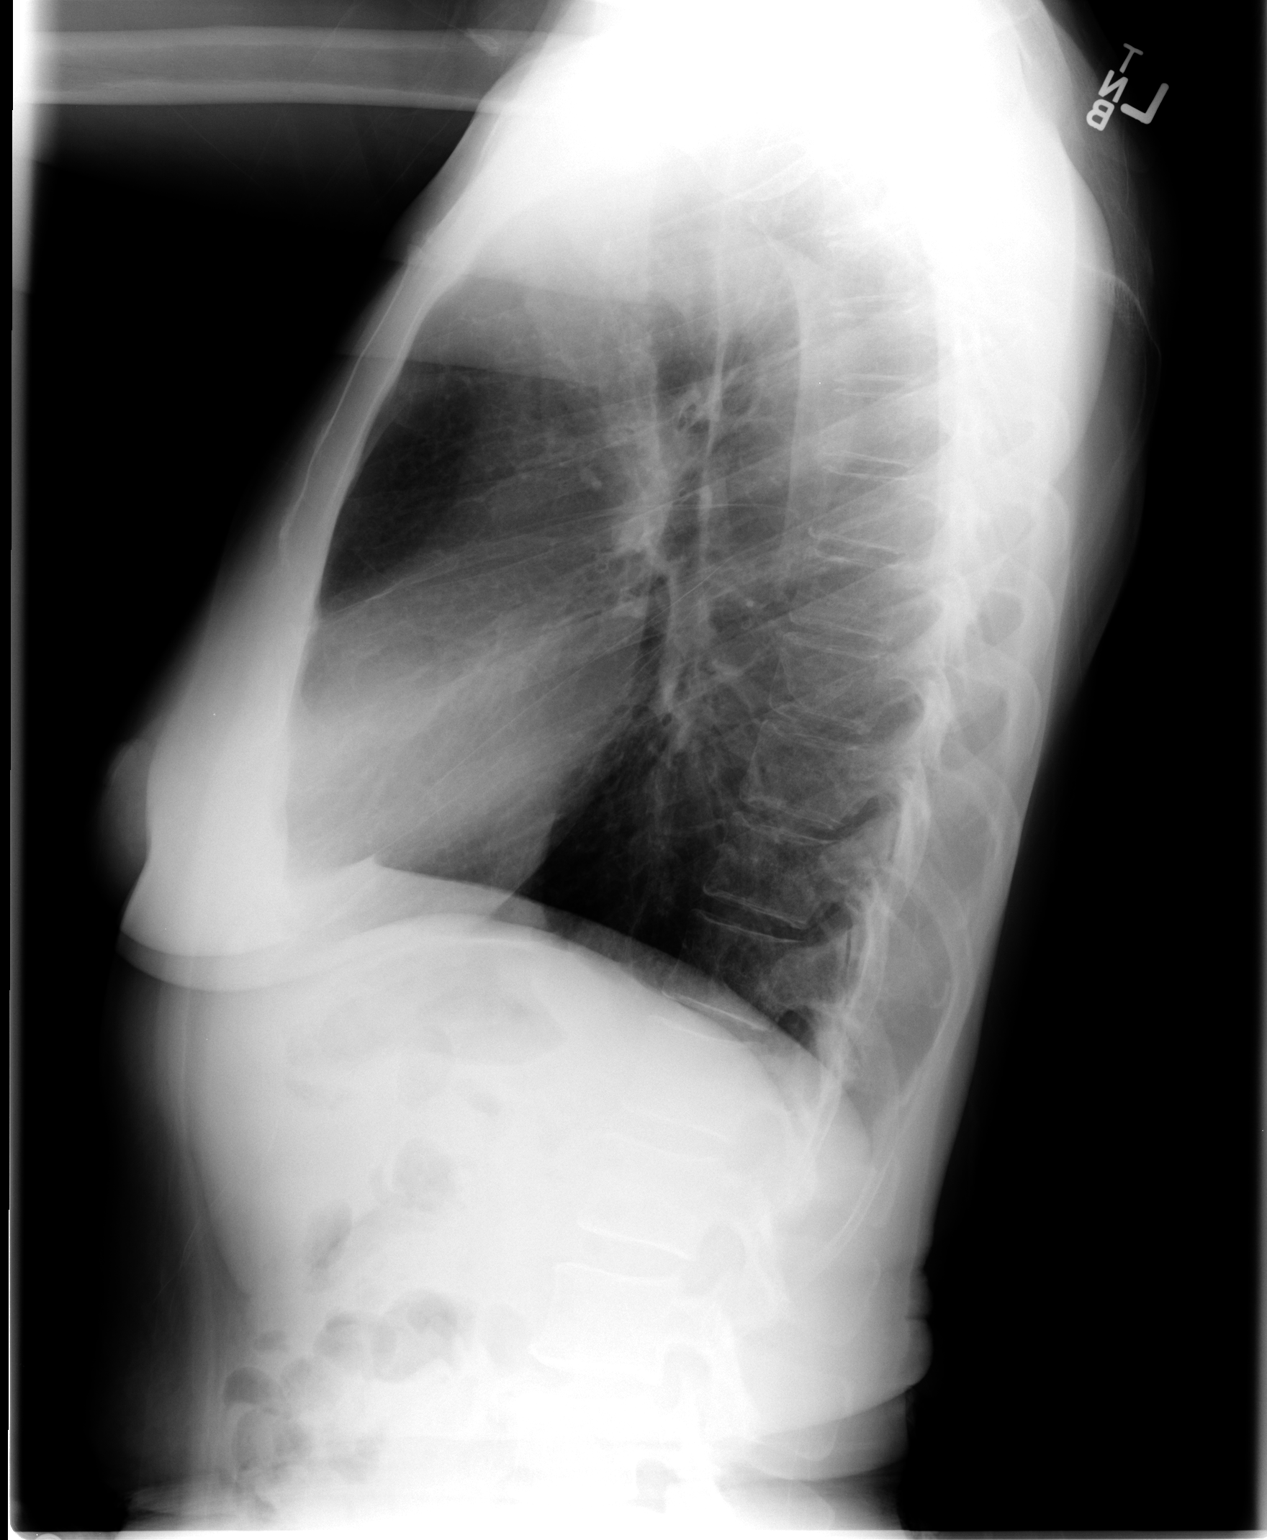

[2 of 2 positions shown; findings below may reference images not displayed]

FINDINGS: The heart size appears normal.

No pleural effusion or pulmonary edema.

No airspace consolidation identified.

Review of the visualized osseous structures is unremarkable.
IMPRESSION: 1.  No active cardiopulmonary abnormalities.

## 2013-09-28 ENCOUNTER — Other Ambulatory Visit (HOSPITAL_COMMUNITY): Payer: Self-pay | Admitting: Family Medicine

## 2013-09-28 DIAGNOSIS — Z139 Encounter for screening, unspecified: Secondary | ICD-10-CM

## 2013-10-01 ENCOUNTER — Ambulatory Visit (HOSPITAL_COMMUNITY)
Admission: RE | Admit: 2013-10-01 | Discharge: 2013-10-01 | Disposition: A | Discharge status: Home / Self Care | Payer: Medicare Other | Source: Ambulatory Visit | Attending: Family Medicine | Admitting: Family Medicine

## 2013-10-01 DIAGNOSIS — M069 Rheumatoid arthritis, unspecified: Secondary | ICD-10-CM | POA: Insufficient documentation

## 2013-10-01 DIAGNOSIS — F17200 Nicotine dependence, unspecified, uncomplicated: Secondary | ICD-10-CM | POA: Insufficient documentation

## 2013-10-01 DIAGNOSIS — Z8781 Personal history of (healed) traumatic fracture: Secondary | ICD-10-CM | POA: Insufficient documentation

## 2013-10-01 DIAGNOSIS — Z780 Asymptomatic menopausal state: Secondary | ICD-10-CM | POA: Insufficient documentation

## 2013-10-01 DIAGNOSIS — Z139 Encounter for screening, unspecified: Secondary | ICD-10-CM

## 2013-11-15 ENCOUNTER — Other Ambulatory Visit (HOSPITAL_COMMUNITY): Payer: Self-pay | Admitting: Family Medicine

## 2013-11-15 DIAGNOSIS — Z139 Encounter for screening, unspecified: Secondary | ICD-10-CM

## 2013-12-14 ENCOUNTER — Ambulatory Visit (HOSPITAL_COMMUNITY)
Admission: RE | Admit: 2013-12-14 | Discharge: 2013-12-14 | Disposition: A | Discharge status: Home / Self Care | Payer: Medicare Other | Source: Ambulatory Visit | Attending: Family Medicine | Admitting: Family Medicine

## 2013-12-14 DIAGNOSIS — Z1231 Encounter for screening mammogram for malignant neoplasm of breast: Secondary | ICD-10-CM | POA: Insufficient documentation

## 2013-12-14 DIAGNOSIS — Z139 Encounter for screening, unspecified: Secondary | ICD-10-CM

## 2014-01-27 ENCOUNTER — Encounter (HOSPITAL_COMMUNITY): Payer: Self-pay | Admitting: Pharmacy Technician

## 2014-01-27 NOTE — H&P (Signed)
  NTS SOAP Note  Vital Signs:  Vitals as of: 01/09/5808: Systolic 983: Diastolic 91: Heart Rate 54: Temp 97.18F: Height 2ft 4in: Weight 128Lbs 0 Ounces: Pain Level 6: BMI 21.97  BMI : 21.97 kg/m2  Subjective: This 26 Years 21 Months old Female presents for of a ventral hernia.  Has been present for some time, causing her more discomfort.  Made worse with straining or standing.  Review of Symptoms:  Constitutional:  fatigue Head:unremarkable    Eyes:  blurred vision bilateral sinus problems, ear infections Cardiovascular:  unremarkable   Respiratory:  wheezing,cough Gastrointestin    abdominal pain,heartburn Genitourinary:    dysuria   joint, back, and neck pain dry Hematolgic/Lymphatic:unremarkable     Allergic/Immunologic:unremarkable     Past Medical History:    Reviewed  Past Medical History  Surgical History: back surgery Medical Problems: back pain, htn, pituitary issues? Allergies: sulfa Medications: metoprolol, amlodipine, losartan, leflunomide, prednisone   Social History:Reviewed  Social History  Preferred Language: English Race:  Black or African American Ethnicity: Not Hispanic / Latino Age: 37 Years 8 Months Marital Status:  M Alcohol: no Recreational drug(s): no   Smoking Status: Current every day smoker reviewed on 01/27/2014 Started Date: 12/16/1969 Packs per day: 0.50 Functional Status reviewed on 01/27/2014 ------------------------------------------------ Bathing: Normal Cooking: Normal Dressing: Normal Driving: Normal Eating: Normal Managing Meds: Normal Oral Care: Normal Shopping: Normal Toileting: Normal Transferring: Normal Walking: Normal Cognitive Status reviewed on 01/27/2014 ------------------------------------------------ Attention: Normal Decision Making: Normal Language: Normal Memory: Normal Motor: Normal Perception: Normal Problem Solving: Normal Visual and Spatial:  Normal   Family History:  [place] History Mother, Deceased; Pancreatic cancer;  Father, Deceased; Kidney or renal cancer;     Objective Information: General:  Well appearing, well nourished in no distress. Heart:  RRR, no murmur Lungs:    CTA bilaterally, no wheezes, rhonchi, rales.  Breathing unlabored. Abdomen:Soft, NT/ND, no HSM, no masses.  Left suprapubic small hernia appreciated, tender, but easily reducible.  Assessment:Ventral hernia  Diagnoses: 553.8 Hernia of anterior abdominal wall without obstruction AND without gangrene (Ventral hernia without obstruction or gangrene)  Procedures: 38250 - OFFICE OUTPATIENT NEW 30 MINUTES    Plan:  Scheduled for ventral herniorrhaphy with mesh on 02/09/14.   Patient Education:Alternative treatments to surgery were discussed with patient (and family).  Risks and benefits  of procedure including bleeding, infection, use of mesh, and recurrence of the hernia were fully explained to the patient (and family) who gave informed consent. Patient/family questions were addressed.  Follow-up:Pending Surgery

## 2014-02-02 NOTE — Patient Instructions (Signed)
RETINA BERNARDY  02/02/2014   Your procedure is scheduled on:  02/09/2014  Report to [facility] at  700  AM.  Call this number if you have problems the morning of surgery: [phone]   Remember:   Do not eat food or drink liquids after midnight.   Take these medicines the morning of surgery with A SIP OF WATER:  Norvasc, losartan, metoprolol, arava, Hydrochlorathiazide   Do not wear jewelry, make-up or nail polish.  Do not wear lotions, powders, or perfumes.   Do not shave 48 hours prior to surgery. Men may shave face and neck.  Do not bring valuables to the hospital.  [facility] is not responsible for any belongings or valuables.               Contacts, dentures or bridgework may not be worn into surgery.  Leave suitcase in the car. After surgery it may be brought to your room.  For patients admitted to the hospital, discharge time is determined by your treatment team.               Patients discharged the day of surgery will not be allowed to drive home.  Name and phone number of your driver: family  Special Instructions: Shower using CHG 2 nights before surgery and the night before surgery.  If you shower the day of surgery use CHG.  Use special wash - you have one bottle of CHG for all showers.  You should use approximately 1/3 of the bottle for each shower.   Please read over the following fact sheets that you were given: Pain Booklet, Coughing and Deep Breathing, Surgical Site Infection Prevention, Anesthesia Post-op Instructions and Care and Recovery After Surgery Hernia A hernia occurs when an internal organ pushes out through a weak spot in the abdominal wall. Hernias most commonly occur in the groin and around the navel. Hernias often can be pushed back into place (reduced). Most hernias tend to get worse over time. Some abdominal hernias can get stuck in the opening (irreducible or incarcerated hernia) and cannot be reduced. An irreducible abdominal hernia which is  tightly squeezed into the opening is at risk for impaired blood supply (strangulated hernia). A strangulated hernia is a medical emergency. Because of the risk for an irreducible or strangulated hernia, surgery may be recommended to repair a hernia. CAUSES   Heavy lifting.  Prolonged coughing.  Straining to have a bowel movement.  A cut (incision) made during an abdominal surgery. HOME CARE INSTRUCTIONS   Bed rest is not required. You may continue your normal activities.  Avoid lifting more than 10 pounds (4.5 kg) or straining.  Cough gently. If you are a smoker it is best to stop. Even the best hernia repair can break down with the continual strain of coughing. Even if you do not have your hernia repaired, a cough will continue to aggravate the problem.  Do not wear anything tight over your hernia. Do not try to keep it in with an outside bandage or truss. These can damage abdominal contents if they are trapped within the hernia sac.  Eat a normal diet.  Avoid constipation. Straining over long periods of time will increase hernia size and encourage breakdown of repairs. If you cannot do this with diet alone, stool softeners may be used. SEEK IMMEDIATE MEDICAL CARE IF:   You have a fever.  You develop increasing abdominal pain.  You feel nauseous or vomit.  Your hernia is stuck outside the abdomen, looks discolored, feels hard, or is tender.  You have any changes in your bowel habits or in the hernia that are unusual for you.  You have increased pain or swelling around the hernia.  You cannot push the hernia back in place by applying gentle pressure while lying down. MAKE SURE YOU:   Understand these instructions.  Will watch your condition.  Will get help right away if you are not doing well or get worse. Document Released: 12/02/2005 Document Revised: 02/24/2012 Document Reviewed: 07/21/2008 [facility] Patient Information 2014 [place]. PATIENT  INSTRUCTIONS POST-ANESTHESIA  IMMEDIATELY FOLLOWING SURGERY:  Do not drive or operate machinery for the first twenty four hours after surgery.  Do not make any important decisions for twenty four hours after surgery or while taking narcotic pain medications or sedatives.  If you develop intractable nausea and vomiting or a severe headache please notify your doctor immediately.  FOLLOW-UP:  Please make an appointment with your surgeon as instructed. You do not need to follow up with anesthesia unless specifically instructed to do so.  WOUND CARE INSTRUCTIONS (if applicable):  Keep a dry clean dressing on the anesthesia/puncture wound site if there is drainage.  Once the wound has quit draining you may leave it open to air.  Generally you should leave the bandage intact for twenty four hours unless there is drainage.  If the epidural site drains for more than 36-48 hours please call the anesthesia department.  QUESTIONS?:  Please feel free to call your physician or the hospital operator if you have any questions, and they will be happy to assist you.

## 2014-02-03 ENCOUNTER — Encounter (HOSPITAL_COMMUNITY): Payer: Self-pay

## 2014-02-03 ENCOUNTER — Encounter (HOSPITAL_COMMUNITY)
Admission: RE | Admit: 2014-02-03 | Discharge: 2014-02-03 | Disposition: A | Discharge status: Home / Self Care | Payer: Medicare HMO | Source: Ambulatory Visit | Attending: General Surgery | Admitting: General Surgery

## 2014-02-03 ENCOUNTER — Other Ambulatory Visit: Payer: Self-pay

## 2014-02-03 DIAGNOSIS — Z01812 Encounter for preprocedural laboratory examination: Secondary | ICD-10-CM | POA: Insufficient documentation

## 2014-02-03 DIAGNOSIS — Z01810 Encounter for preprocedural cardiovascular examination: Secondary | ICD-10-CM | POA: Insufficient documentation

## 2014-02-03 LAB — CBC WITH DIFFERENTIAL/PLATELET
BASOS ABS: 0 10*3/uL (ref 0.0–0.1)
BASOS PCT: 0 % (ref 0–1)
EOS PCT: 1 % (ref 0–5)
Eosinophils Absolute: 0.1 10*3/uL (ref 0.0–0.7)
HEMATOCRIT: 42.1 % (ref 36.0–46.0)
Hemoglobin: 14 g/dL (ref 12.0–15.0)
Lymphocytes Relative: 32 % (ref 12–46)
Lymphs Abs: 2.9 10*3/uL (ref 0.7–4.0)
MCH: 29 pg (ref 26.0–34.0)
MCHC: 33.3 g/dL (ref 30.0–36.0)
MCV: 87.3 fL (ref 78.0–100.0)
MONO ABS: 0.6 10*3/uL (ref 0.1–1.0)
Monocytes Relative: 7 % (ref 3–12)
Neutro Abs: 5.4 10*3/uL (ref 1.7–7.7)
Neutrophils Relative %: 60 % (ref 43–77)
Platelets: 281 10*3/uL (ref 150–400)
RBC: 4.82 MIL/uL (ref 3.87–5.11)
RDW: 13.8 % (ref 11.5–15.5)
WBC: 9.1 10*3/uL (ref 4.0–10.5)

## 2014-02-03 LAB — BASIC METABOLIC PANEL
BUN: 9 mg/dL (ref 6–23)
CHLORIDE: 103 meq/L (ref 96–112)
CO2: 28 meq/L (ref 19–32)
CREATININE: 0.78 mg/dL (ref 0.50–1.10)
Calcium: 9.4 mg/dL (ref 8.4–10.5)
GFR calc Af Amer: >90 mL/min (ref >90)
GFR calc non Af Amer: 87 mL/min — ABNORMAL LOW (ref >90)
Glucose, Bld: 81 mg/dL (ref 70–99)
Potassium: 3.7 mEq/L (ref 3.7–5.3)
Sodium: 143 mEq/L (ref 137–147)

## 2014-02-09 ENCOUNTER — Encounter (HOSPITAL_COMMUNITY): Payer: Self-pay | Admitting: *Deleted

## 2014-02-09 ENCOUNTER — Encounter (HOSPITAL_COMMUNITY): Payer: Medicare HMO | Admitting: Anesthesiology

## 2014-02-09 ENCOUNTER — Ambulatory Visit (HOSPITAL_COMMUNITY): Payer: Medicare HMO | Admitting: Anesthesiology

## 2014-02-09 ENCOUNTER — Encounter (HOSPITAL_COMMUNITY)
Admission: RE | Disposition: A | Discharge status: Home / Self Care | Payer: Medicare HMO | Source: Ambulatory Visit | Attending: General Surgery

## 2014-02-09 ENCOUNTER — Ambulatory Visit (HOSPITAL_COMMUNITY)
Admission: RE | Admit: 2014-02-09 | Discharge: 2014-02-09 | Disposition: A | Discharge status: Home / Self Care | Payer: Medicare HMO | Source: Ambulatory Visit | Attending: General Surgery | Admitting: General Surgery

## 2014-02-09 DIAGNOSIS — K4090 Unilateral inguinal hernia, without obstruction or gangrene, not specified as recurrent: Secondary | ICD-10-CM | POA: Insufficient documentation

## 2014-02-09 DIAGNOSIS — F17200 Nicotine dependence, unspecified, uncomplicated: Secondary | ICD-10-CM | POA: Insufficient documentation

## 2014-02-09 DIAGNOSIS — Z79899 Other long term (current) drug therapy: Secondary | ICD-10-CM | POA: Insufficient documentation

## 2014-02-09 DIAGNOSIS — I10 Essential (primary) hypertension: Secondary | ICD-10-CM | POA: Insufficient documentation

## 2014-02-09 HISTORY — PX: VENTRAL HERNIA REPAIR: SHX424

## 2014-02-09 HISTORY — PX: INSERTION OF MESH: SHX5868

## 2014-02-09 SURGERY — REPAIR, HERNIA, VENTRAL
Anesthesia: General | Site: Abdomen

## 2014-02-09 MED ORDER — BUPIVACAINE LIPOSOME 1.3 % IJ SUSP
INTRAMUSCULAR | Status: DC | PRN
  Administered 2014-02-09: 12 mL

## 2014-02-09 MED ORDER — HYDROCODONE-ACETAMINOPHEN 5-325 MG PO TABS
1.0000 | ORAL_TABLET | Freq: Four times a day (QID) | ORAL | Status: AC | PRN
Start: 2014-02-09 — End: 2015-02-09

## 2014-02-09 MED ORDER — FENTANYL CITRATE 0.05 MG/ML IJ SOLN
25.0000 ug | INTRAMUSCULAR | Status: DC | PRN
  Administered 2014-02-09 (×2): 50 ug via INTRAVENOUS

## 2014-02-09 MED ORDER — 0.9 % SODIUM CHLORIDE (POUR BTL) OPTIME
TOPICAL | Status: DC | PRN
  Administered 2014-02-09: 1000 mL

## 2014-02-09 MED ORDER — FENTANYL CITRATE 0.05 MG/ML IJ SOLN
INTRAMUSCULAR | Status: AC
  Filled 2014-02-09: qty 2

## 2014-02-09 MED ORDER — ONDANSETRON HCL 4 MG/2ML IJ SOLN
INTRAMUSCULAR | Status: AC
  Filled 2014-02-09: qty 2

## 2014-02-09 MED ORDER — HYDROCORTISONE NA SUCCINATE PF 100 MG IJ SOLR
100.0000 mg | Freq: Once | INTRAMUSCULAR | Status: AC
  Administered 2014-02-09: 100 mg via INTRAVENOUS
  Filled 2014-02-09: qty 2

## 2014-02-09 MED ORDER — ROCURONIUM BROMIDE 50 MG/5ML IV SOLN
INTRAVENOUS | Status: AC
  Filled 2014-02-09: qty 1

## 2014-02-09 MED ORDER — MIDAZOLAM HCL 2 MG/2ML IJ SOLN
INTRAMUSCULAR | Status: AC
  Filled 2014-02-09: qty 2

## 2014-02-09 MED ORDER — PROPOFOL 10 MG/ML IV EMUL
INTRAVENOUS | Status: AC
Start: 2014-02-09 — End: 2014-02-09
  Filled 2014-02-09: qty 20

## 2014-02-09 MED ORDER — FENTANYL CITRATE 0.05 MG/ML IJ SOLN
INTRAMUSCULAR | Status: AC
  Filled 2014-02-09: qty 5

## 2014-02-09 MED ORDER — NEOSTIGMINE METHYLSULFATE 1 MG/ML IJ SOLN
INTRAMUSCULAR | Status: DC | PRN
  Administered 2014-02-09: 2.5 mg via INTRAVENOUS

## 2014-02-09 MED ORDER — CEFAZOLIN SODIUM-DEXTROSE 2-3 GM-% IV SOLR
2.0000 g | INTRAVENOUS | Status: AC
  Administered 2014-02-09: 2 g via INTRAVENOUS
  Filled 2014-02-09: qty 50

## 2014-02-09 MED ORDER — CHLORHEXIDINE GLUCONATE 4 % EX LIQD: 1.0000 "application " | Freq: Once | CUTANEOUS | Status: DC

## 2014-02-09 MED ORDER — BUPIVACAINE LIPOSOME 1.3 % IJ SUSP
20.0000 mL | Freq: Once | INTRAMUSCULAR | Status: DC
  Filled 2014-02-09: qty 20

## 2014-02-09 MED ORDER — KETOROLAC TROMETHAMINE 30 MG/ML IJ SOLN
30.0000 mg | Freq: Once | INTRAMUSCULAR | Status: AC
  Administered 2014-02-09: 30 mg via INTRAVENOUS
  Filled 2014-02-09: qty 1

## 2014-02-09 MED ORDER — EPHEDRINE SULFATE 50 MG/ML IJ SOLN
INTRAMUSCULAR | Status: DC | PRN
  Administered 2014-02-09 (×3): 5 mg via INTRAVENOUS

## 2014-02-09 MED ORDER — LIDOCAINE HCL (PF) 1 % IJ SOLN
INTRAMUSCULAR | Status: AC
  Filled 2014-02-09: qty 5

## 2014-02-09 MED ORDER — GLYCOPYRROLATE 0.2 MG/ML IJ SOLN
INTRAMUSCULAR | Status: AC
  Filled 2014-02-09: qty 2

## 2014-02-09 MED ORDER — BUPIVACAINE HCL (PF) 0.5 % IJ SOLN
INTRAMUSCULAR | Status: AC
  Filled 2014-02-09: qty 30

## 2014-02-09 MED ORDER — EPHEDRINE SULFATE 50 MG/ML IJ SOLN
INTRAMUSCULAR | Status: AC
  Filled 2014-02-09: qty 1

## 2014-02-09 MED ORDER — ROCURONIUM BROMIDE 100 MG/10ML IV SOLN
INTRAVENOUS | Status: DC | PRN
  Administered 2014-02-09: 30 mg via INTRAVENOUS

## 2014-02-09 MED ORDER — FENTANYL CITRATE 0.05 MG/ML IJ SOLN
INTRAMUSCULAR | Status: DC | PRN
  Administered 2014-02-09: 50 ug via INTRAVENOUS

## 2014-02-09 MED ORDER — MIDAZOLAM HCL 2 MG/2ML IJ SOLN
1.0000 mg | INTRAMUSCULAR | Status: DC | PRN
Start: 2014-02-09 — End: 2014-02-09
  Administered 2014-02-09: 2 mg via INTRAVENOUS

## 2014-02-09 MED ORDER — GLYCOPYRROLATE 0.2 MG/ML IJ SOLN
INTRAMUSCULAR | Status: DC | PRN
  Administered 2014-02-09: 0.4 mg via INTRAVENOUS

## 2014-02-09 MED ORDER — LIDOCAINE HCL 1 % IJ SOLN
INTRAMUSCULAR | Status: DC | PRN
  Administered 2014-02-09: 30 mg via INTRADERMAL

## 2014-02-09 MED ORDER — ONDANSETRON HCL 4 MG/2ML IJ SOLN: 4.0000 mg | Freq: Once | INTRAMUSCULAR | Status: DC | PRN

## 2014-02-09 MED ORDER — ONDANSETRON HCL 4 MG/2ML IJ SOLN
4.0000 mg | Freq: Once | INTRAMUSCULAR | Status: AC
  Administered 2014-02-09: 4 mg via INTRAVENOUS

## 2014-02-09 MED ORDER — SODIUM CHLORIDE BACTERIOSTATIC 0.9 % IJ SOLN
INTRAMUSCULAR | Status: AC
  Filled 2014-02-09: qty 10

## 2014-02-09 MED ORDER — PROPOFOL 10 MG/ML IV BOLUS
INTRAVENOUS | Status: DC | PRN
  Administered 2014-02-09: 120 mg via INTRAVENOUS

## 2014-02-09 MED ORDER — LACTATED RINGERS IV SOLN
INTRAVENOUS | Status: DC
  Administered 2014-02-09: 1000 mL via INTRAVENOUS

## 2014-02-09 SURGICAL SUPPLY — 46 items
ADH SKN CLS APL DERMABOND .7 (GAUZE/BANDAGES/DRESSINGS) ×1
BAG HAMPER (MISCELLANEOUS) ×3 IMPLANT
BLADE SURG SZ11 CARB STEEL (BLADE) IMPLANT
BLADE SURG SZ12 CARB STEEL (BLADE) ×3 IMPLANT
CLOTH BEACON ORANGE TIMEOUT ST (SAFETY) ×3 IMPLANT
COVER LIGHT HANDLE STERIS (MISCELLANEOUS) ×6 IMPLANT
DECANTER SPIKE VIAL GLASS SM (MISCELLANEOUS) ×3 IMPLANT
DERMABOND ADVANCED (GAUZE/BANDAGES/DRESSINGS) ×1
DERMABOND ADVANCED .7 DNX12 (GAUZE/BANDAGES/DRESSINGS) ×2 IMPLANT
DURAPREP 26ML APPLICATOR (WOUND CARE) ×3 IMPLANT
ELECT REM PT RETURN 9FT ADLT (ELECTROSURGICAL) ×3
ELECTRODE REM PT RTRN 9FT ADLT (ELECTROSURGICAL) ×2 IMPLANT
ERX1223 IMPLANT
FORMALIN 10 PREFIL 480ML (MISCELLANEOUS) ×3 IMPLANT
GLOVE BIO SURGEON STRL SZ7.5 (GLOVE) ×3 IMPLANT
GLOVE BIOGEL PI IND STRL 7.5 (GLOVE) ×2 IMPLANT
GLOVE BIOGEL PI IND STRL 8 (GLOVE) ×2 IMPLANT
GLOVE BIOGEL PI INDICATOR 7.5 (GLOVE) ×1
GLOVE BIOGEL PI INDICATOR 8 (GLOVE) ×1
GLOVE ECLIPSE 7.5 STRL STRAW (GLOVE) ×3 IMPLANT
GOWN STRL REUS W/TWL LRG LVL3 (GOWN DISPOSABLE) ×9 IMPLANT
INST SET MAJOR GENERAL (KITS) ×3 IMPLANT
KIT ROOM TURNOVER APOR (KITS) ×3 IMPLANT
MANIFOLD NEPTUNE II (INSTRUMENTS) ×3 IMPLANT
MESH MARLEX PLUG MEDIUM (Mesh General) ×3 IMPLANT
NEEDLE HYPO 18GX1.5 BLUNT FILL (NEEDLE) ×3 IMPLANT
NEEDLE HYPO 25X1 1.5 SAFETY (NEEDLE) ×6 IMPLANT
NS IRRIG 1000ML POUR BTL (IV SOLUTION) ×3 IMPLANT
PACK ABDOMINAL MAJOR (CUSTOM PROCEDURE TRAY) ×3 IMPLANT
PAD ARMBOARD 7.5X6 YLW CONV (MISCELLANEOUS) ×3 IMPLANT
SET BASIN LINEN APH (SET/KITS/TRAYS/PACK) ×3 IMPLANT
SPONGE GAUZE 4X4 12PLY (GAUZE/BANDAGES/DRESSINGS) IMPLANT
STAPLER VISISTAT (STAPLE) ×3 IMPLANT
SUT ETHIBOND NAB MO 7 #0 18IN (SUTURE) IMPLANT
SUT NOVA NAB GS-21 1 T12 (SUTURE) IMPLANT
SUT NOVA NAB GS-22 2 2-0 T-19 (SUTURE) IMPLANT
SUT NOVA NAB GS-26 0 60 (SUTURE) IMPLANT
SUT SILK 2 0 (SUTURE)
SUT SILK 2-0 18XBRD TIE 12 (SUTURE) IMPLANT
SUT VIC AB 2-0 CT2 27 (SUTURE) ×3 IMPLANT
SUT VIC AB 3-0 SH 27 (SUTURE) ×3
SUT VIC AB 3-0 SH 27X BRD (SUTURE) ×2 IMPLANT
SUT VIC AB 4-0 PS2 27 (SUTURE) ×3 IMPLANT
SUT VICRYL AB 2 0 TIES (SUTURE) IMPLANT
SYR 20CC LL (SYRINGE) ×6 IMPLANT
SYR CONTROL 10ML LL (SYRINGE) ×3 IMPLANT

## 2014-02-09 NOTE — Interval H&P Note (Signed)
History and Physical Interval Note:  02/09/2014 8:18 AM  Isabel Woods  has presented today for surgery, with the diagnosis of ventral hernia  The various methods of treatment have been discussed with the patient and family. After consideration of risks, benefits and other options for treatment, the patient has consented to  Procedure(s): HERNIA REPAIR VENTRAL ADULT WITH MESH (N/A) as a surgical intervention .  The patient's history has been reviewed, patient examined, no change in status, stable for surgery.  I have reviewed the patient's chart and labs.  Questions were answered to the patient's satisfaction.     Aviva Signs A

## 2014-02-09 NOTE — Anesthesia Postprocedure Evaluation (Signed)
  Anesthesia Post-op Note  Patient: Isabel Woods  Procedure(s) Performed: Procedure(s): LEFT INGUINAL HERNIORRHAPHY WITH MESH (N/A)  Patient Location: PACU  Anesthesia Type:General  Level of Consciousness: awake, alert  and oriented  Airway and Oxygen Therapy: Patient Spontanous Breathing and Patient connected to face mask oxygen  Post-op Pain: none  Post-op Assessment: Post-op Vital signs reviewed, Patient's Cardiovascular Status Stable, Respiratory Function Stable, Patent Airway and No signs of Nausea or vomiting  Post-op Vital Signs: Reviewed and stable  Complications: No apparent anesthesia complications

## 2014-02-09 NOTE — Discharge Instructions (Signed)
Inguinal Hernia, Adult  °Care After °Refer to this sheet in the next few weeks. These discharge instructions provide you with general information on caring for yourself after you leave the hospital. Your caregiver may also give you specific instructions. Your treatment has been planned according to the most current medical practices available, but unavoidable complications sometimes occur. If you have any problems or questions after discharge, please call your caregiver. °HOME CARE INSTRUCTIONS °· Put ice on the operative site. °· Put ice in a plastic bag. °· Place a towel between your skin and the bag. °· Leave the ice on for 15-20 minutes at a time, 03-04 times a day while awake. °· Change bandages (dressings) as directed. °· Keep the wound dry and clean. The wound may be washed gently with soap and water. Gently blot or dab the wound dry. It is okay to take showers 24 to 48 hours after surgery. Do not take baths, use swimming pools, or use hot tubs for 10 days, or as directed by your caregiver. °· Only take over-the-counter or prescription medicines for pain, discomfort, or fever as directed by your caregiver. °· Continue your normal diet as directed. °· Do not lift anything more than 10 pounds or play contact sports for 3 weeks, or as directed. °SEEK MEDICAL CARE IF: °· There is redness, swelling, or increasing pain in the wound. °· There is fluid (pus) coming from the wound. °· There is drainage from a wound lasting longer than 1 day. °· You have an oral temperature above 102° F (38.9° C). °· You notice a bad smell coming from the wound or dressing. °· The wound breaks open after the stitches (sutures) have been removed. °· You notice increasing pain in the shoulders (shoulder strap areas). °· You develop dizzy episodes or fainting while standing. °· You feel sick to your stomach (nauseous) or throw up (vomit). °SEEK IMMEDIATE MEDICAL CARE IF: °· You develop a rash. °· You have difficulty breathing. °· You  develop a reaction or have side effects to medicines you were given. °MAKE SURE YOU:  °· Understand these instructions. °· Will watch your condition. °· Will get help right away if you are not doing well or get worse. °Document Released: 01/02/2007 Document Revised: 02/24/2012 Document Reviewed: 11/01/2009 °Exit[employer]® Patient Information ©2014 [employer]. ° °

## 2014-02-09 NOTE — Op Note (Signed)
Patient:  Isabel Woods  DOB:  1951/03/07  MRN:  [id number]   Preop Diagnosis:  Ventral hernia  Postop Diagnosis:  Left inguinal hernia  Procedure:  Left inguinal herniorrhaphy with mesh  Surgeon:  Aviva Signs, M.D.  Anes:  General  Indications:  Patient is a 63 year old black female who presents with a left suprapubic swelling consistent with a hernia. The risks and benefits of the procedure including bleeding, infection, the use of mesh, the possibly recurrence of the hernia were fully explained to the patient, who gave informed consent.  Procedure note:  The patient is placed the supine position. After general anesthesia was administered, the lower abdomen was prepped and draped using the usual sterile technique with DuraPrep. Surgical site confirmation was performed.  A transverse incision was made in the left groin region. The dissection was taken down to the external oblique aponeuroses. Aponeuroses was incised to the external ring. On inspection, the patient had an indirect hernia sac which extended down into the suprapubic soft tissue. The processes vaginalis was divided. The indirect hernia sac was then freed away up to the peritoneal reflection and inverted. A medium size Bard mesh PerFix plug was then inserted into the indirect hernia. Next, the conjoined tendon was secured to the shelving edge of Poupart's ligament using 2-0 Novafil interrupted sutures. A relaxing incision was made along the upper portion the conjoined tendon in order to have a tension free repair. The external oblique aponeuroses was reapproximated using 2-0 Vicryl running suture. The subcutaneous layer was reapproximated using 3-0 Vicryl interrupted suture. The skin was closed a 4 Vicryl subcuticular suture.  Exparel was instilled the surrounding wound. Dermabond was then applied.  All tape and needle counts were correct at the end of the procedure. The patient was awakened in the operating room and transferred to  PACU in stable condition.  Complications:  None  EBL:  Minimal  Specimen:  None

## 2014-02-09 NOTE — Anesthesia Preprocedure Evaluation (Signed)
Anesthesia Evaluation  Patient identified by MRN, date of birth, ID band Patient awake    Reviewed: Allergy & Precautions, H&P , NPO status , Patient's Chart, lab work & pertinent test results, reviewed documented beta blocker date and time   Airway Mallampati: II TM Distance: >3 FB Neck ROM: Full    Dental  (+) Dental Advisory Given   Pulmonary Current Smoker,  breath sounds clear to auscultation        Cardiovascular hypertension, Pt. on medications and Pt. on home beta blockers Rhythm:Regular Rate:Normal     Neuro/Psych    GI/Hepatic   Endo/Other    Renal/GU      Musculoskeletal  (+) Arthritis -, Osteoarthritis,    Abdominal   Peds  Hematology   Anesthesia Other Findings   Reproductive/Obstetrics                           Anesthesia Physical Anesthesia Plan  ASA: II  Anesthesia Plan: General   Post-op Pain Management:    Induction:   Airway Management Planned: Oral ETT  Additional Equipment:   Intra-op Plan:   Post-operative Plan: Extubation in OR  Informed Consent: I have reviewed the patients History and Physical, chart, labs and discussed the procedure including the risks, benefits and alternatives for the proposed anesthesia with the patient or authorized representative who has indicated his/her understanding and acceptance.   Dental advisory given  Plan Discussed with: Anesthesiologist and CRNA  Anesthesia Plan Comments:         Anesthesia Quick Evaluation

## 2014-02-09 NOTE — Anesthesia Procedure Notes (Signed)
Procedure Name: Intubation Date/Time: 02/09/2014 8:40 AM Performed by: Tressie Stalker E Pre-anesthesia Checklist: Patient identified, Patient being monitored, Timeout performed, Emergency Drugs available and Suction available Patient Re-evaluated:Patient Re-evaluated prior to inductionOxygen Delivery Method: Circle System Utilized Preoxygenation: Pre-oxygenation with 100% oxygen Intubation Type: IV induction Ventilation: Mask ventilation without difficulty Laryngoscope Size: Mac and 3 Grade View: Grade I Tube type: Oral Tube size: 7.0 mm Number of attempts: 1 Airway Equipment and Method: stylet Placement Confirmation: ETT inserted through vocal cords under direct vision,  positive ETCO2 and breath sounds checked- equal and bilateral Secured at: 21 cm Tube secured with: Tape Dental Injury: Teeth and Oropharynx as per pre-operative assessment

## 2014-02-09 NOTE — Transfer of Care (Signed)
Immediate Anesthesia Transfer of Care Note  Patient: Isabel Woods  Procedure(s) Performed: Procedure(s): LEFT INGUINAL HERNIORRHAPHY WITH MESH (N/A)  Patient Location: PACU  Anesthesia Type:General  Level of Consciousness: awake  Airway & Oxygen Therapy: Patient Spontanous Breathing and Patient connected to face mask oxygen  Post-op Assessment: Report given to PACU RN  Post vital signs: Reviewed and stable  Complications: No apparent anesthesia complications

## 2014-02-11 ENCOUNTER — Encounter (HOSPITAL_COMMUNITY): Payer: Self-pay | Admitting: General Surgery

## 2014-02-14 MED FILL — Bupivacaine HCl Inj 0.5%: INTRAMUSCULAR | Qty: 50 | Status: AC

## 2015-02-14 ENCOUNTER — Other Ambulatory Visit (HOSPITAL_COMMUNITY): Payer: Self-pay | Admitting: Family Medicine

## 2015-02-14 DIAGNOSIS — Z1231 Encounter for screening mammogram for malignant neoplasm of breast: Secondary | ICD-10-CM

## 2015-02-22 ENCOUNTER — Ambulatory Visit (HOSPITAL_COMMUNITY)
Admission: RE | Admit: 2015-02-22 | Discharge: 2015-02-22 | Disposition: A | Discharge status: Home / Self Care | Payer: PPO | Source: Ambulatory Visit | Attending: Family Medicine | Admitting: Family Medicine

## 2015-02-22 DIAGNOSIS — Z1231 Encounter for screening mammogram for malignant neoplasm of breast: Secondary | ICD-10-CM | POA: Insufficient documentation

## 2015-06-01 ENCOUNTER — Other Ambulatory Visit (HOSPITAL_COMMUNITY): Payer: Self-pay | Admitting: Family Medicine

## 2015-06-01 ENCOUNTER — Ambulatory Visit (HOSPITAL_COMMUNITY)
Admission: RE | Admit: 2015-06-01 | Discharge: 2015-06-01 | Disposition: A | Discharge status: Home / Self Care | Payer: Medicare Other | Source: Ambulatory Visit | Attending: Family Medicine | Admitting: Family Medicine

## 2015-06-01 DIAGNOSIS — J209 Acute bronchitis, unspecified: Secondary | ICD-10-CM

## 2015-06-01 DIAGNOSIS — Z6821 Body mass index (BMI) 21.0-21.9, adult: Secondary | ICD-10-CM | POA: Diagnosis not present

## 2015-06-01 DIAGNOSIS — Z0000 Encounter for general adult medical examination without abnormal findings: Secondary | ICD-10-CM | POA: Diagnosis not present

## 2015-06-01 DIAGNOSIS — R1013 Epigastric pain: Secondary | ICD-10-CM | POA: Diagnosis not present

## 2015-06-01 DIAGNOSIS — E782 Mixed hyperlipidemia: Secondary | ICD-10-CM | POA: Diagnosis not present

## 2015-06-01 DIAGNOSIS — N390 Urinary tract infection, site not specified: Secondary | ICD-10-CM | POA: Diagnosis not present

## 2015-06-01 DIAGNOSIS — R05 Cough: Secondary | ICD-10-CM | POA: Insufficient documentation

## 2015-06-01 DIAGNOSIS — I10 Essential (primary) hypertension: Secondary | ICD-10-CM | POA: Diagnosis not present

## 2015-07-19 DIAGNOSIS — M0589 Other rheumatoid arthritis with rheumatoid factor of multiple sites: Secondary | ICD-10-CM | POA: Diagnosis not present

## 2015-07-19 DIAGNOSIS — M150 Primary generalized (osteo)arthritis: Secondary | ICD-10-CM | POA: Diagnosis not present

## 2015-07-19 DIAGNOSIS — M5030 Other cervical disc degeneration, unspecified cervical region: Secondary | ICD-10-CM | POA: Diagnosis not present

## 2015-09-27 DIAGNOSIS — Z6821 Body mass index (BMI) 21.0-21.9, adult: Secondary | ICD-10-CM | POA: Diagnosis not present

## 2015-09-27 DIAGNOSIS — Z23 Encounter for immunization: Secondary | ICD-10-CM | POA: Diagnosis not present

## 2015-09-27 DIAGNOSIS — I10 Essential (primary) hypertension: Secondary | ICD-10-CM | POA: Diagnosis not present

## 2015-09-27 DIAGNOSIS — Z1389 Encounter for screening for other disorder: Secondary | ICD-10-CM | POA: Diagnosis not present

## 2015-09-27 DIAGNOSIS — E782 Mixed hyperlipidemia: Secondary | ICD-10-CM | POA: Diagnosis not present

## 2015-10-19 DIAGNOSIS — M5030 Other cervical disc degeneration, unspecified cervical region: Secondary | ICD-10-CM | POA: Diagnosis not present

## 2015-10-19 DIAGNOSIS — M0589 Other rheumatoid arthritis with rheumatoid factor of multiple sites: Secondary | ICD-10-CM | POA: Diagnosis not present

## 2015-10-19 DIAGNOSIS — M150 Primary generalized (osteo)arthritis: Secondary | ICD-10-CM | POA: Diagnosis not present

## 2015-10-19 DIAGNOSIS — M7989 Other specified soft tissue disorders: Secondary | ICD-10-CM | POA: Diagnosis not present

## 2016-01-19 DIAGNOSIS — M0589 Other rheumatoid arthritis with rheumatoid factor of multiple sites: Secondary | ICD-10-CM | POA: Diagnosis not present

## 2016-01-19 DIAGNOSIS — M150 Primary generalized (osteo)arthritis: Secondary | ICD-10-CM | POA: Diagnosis not present

## 2016-01-19 DIAGNOSIS — M5030 Other cervical disc degeneration, unspecified cervical region: Secondary | ICD-10-CM | POA: Diagnosis not present

## 2016-03-04 ENCOUNTER — Other Ambulatory Visit (HOSPITAL_COMMUNITY): Payer: Self-pay | Admitting: Family Medicine

## 2016-03-04 DIAGNOSIS — Z1231 Encounter for screening mammogram for malignant neoplasm of breast: Secondary | ICD-10-CM

## 2016-03-06 ENCOUNTER — Ambulatory Visit (HOSPITAL_COMMUNITY)
Admission: RE | Admit: 2016-03-06 | Discharge: 2016-03-06 | Disposition: A | Discharge status: Home / Self Care | Payer: Medicare Other | Source: Ambulatory Visit | Attending: Family Medicine | Admitting: Family Medicine

## 2016-03-06 DIAGNOSIS — R928 Other abnormal and inconclusive findings on diagnostic imaging of breast: Secondary | ICD-10-CM | POA: Insufficient documentation

## 2016-03-06 DIAGNOSIS — Z1231 Encounter for screening mammogram for malignant neoplasm of breast: Secondary | ICD-10-CM | POA: Diagnosis not present

## 2016-03-11 ENCOUNTER — Other Ambulatory Visit: Payer: Self-pay | Admitting: Family Medicine

## 2016-03-11 DIAGNOSIS — R928 Other abnormal and inconclusive findings on diagnostic imaging of breast: Secondary | ICD-10-CM

## 2016-03-12 ENCOUNTER — Other Ambulatory Visit (HOSPITAL_COMMUNITY): Payer: Self-pay | Admitting: Family Medicine

## 2016-03-12 DIAGNOSIS — R928 Other abnormal and inconclusive findings on diagnostic imaging of breast: Secondary | ICD-10-CM

## 2016-03-18 DIAGNOSIS — Z6821 Body mass index (BMI) 21.0-21.9, adult: Secondary | ICD-10-CM | POA: Diagnosis not present

## 2016-03-18 DIAGNOSIS — Z1389 Encounter for screening for other disorder: Secondary | ICD-10-CM | POA: Diagnosis not present

## 2016-03-18 DIAGNOSIS — Z719 Counseling, unspecified: Secondary | ICD-10-CM | POA: Diagnosis not present

## 2016-03-18 DIAGNOSIS — I10 Essential (primary) hypertension: Secondary | ICD-10-CM | POA: Diagnosis not present

## 2016-03-18 DIAGNOSIS — E782 Mixed hyperlipidemia: Secondary | ICD-10-CM | POA: Diagnosis not present

## 2016-03-18 DIAGNOSIS — J209 Acute bronchitis, unspecified: Secondary | ICD-10-CM | POA: Diagnosis not present

## 2016-03-19 ENCOUNTER — Ambulatory Visit (HOSPITAL_COMMUNITY)
Admission: RE | Admit: 2016-03-19 | Discharge: 2016-03-19 | Disposition: A | Discharge status: Home / Self Care | Payer: Medicare Other | Source: Ambulatory Visit | Attending: Family Medicine | Admitting: Family Medicine

## 2016-03-19 DIAGNOSIS — R928 Other abnormal and inconclusive findings on diagnostic imaging of breast: Secondary | ICD-10-CM

## 2016-03-19 DIAGNOSIS — N6002 Solitary cyst of left breast: Secondary | ICD-10-CM | POA: Insufficient documentation

## 2016-04-17 DIAGNOSIS — M150 Primary generalized (osteo)arthritis: Secondary | ICD-10-CM | POA: Diagnosis not present

## 2016-04-17 DIAGNOSIS — M8589 Other specified disorders of bone density and structure, multiple sites: Secondary | ICD-10-CM | POA: Diagnosis not present

## 2016-04-17 DIAGNOSIS — M5030 Other cervical disc degeneration, unspecified cervical region: Secondary | ICD-10-CM | POA: Diagnosis not present

## 2016-04-17 DIAGNOSIS — M0589 Other rheumatoid arthritis with rheumatoid factor of multiple sites: Secondary | ICD-10-CM | POA: Diagnosis not present

## 2016-04-18 ENCOUNTER — Other Ambulatory Visit: Payer: Self-pay | Admitting: Physician Assistant

## 2016-04-18 DIAGNOSIS — M8580 Other specified disorders of bone density and structure, unspecified site: Secondary | ICD-10-CM

## 2016-04-25 ENCOUNTER — Ambulatory Visit (HOSPITAL_COMMUNITY)
Admission: RE | Admit: 2016-04-25 | Discharge: 2016-04-25 | Disposition: A | Discharge status: Home / Self Care | Payer: Medicare Other | Source: Ambulatory Visit | Attending: Physician Assistant | Admitting: Physician Assistant

## 2016-04-25 DIAGNOSIS — M8588 Other specified disorders of bone density and structure, other site: Secondary | ICD-10-CM | POA: Diagnosis not present

## 2016-04-25 DIAGNOSIS — M8580 Other specified disorders of bone density and structure, unspecified site: Secondary | ICD-10-CM

## 2016-04-25 DIAGNOSIS — M8589 Other specified disorders of bone density and structure, multiple sites: Secondary | ICD-10-CM | POA: Diagnosis present

## 2016-07-23 DIAGNOSIS — M150 Primary generalized (osteo)arthritis: Secondary | ICD-10-CM | POA: Diagnosis not present

## 2016-07-23 DIAGNOSIS — M8589 Other specified disorders of bone density and structure, multiple sites: Secondary | ICD-10-CM | POA: Diagnosis not present

## 2016-07-23 DIAGNOSIS — M0589 Other rheumatoid arthritis with rheumatoid factor of multiple sites: Secondary | ICD-10-CM | POA: Diagnosis not present

## 2016-07-23 DIAGNOSIS — M5030 Other cervical disc degeneration, unspecified cervical region: Secondary | ICD-10-CM | POA: Diagnosis not present

## 2016-08-08 DIAGNOSIS — E782 Mixed hyperlipidemia: Secondary | ICD-10-CM | POA: Diagnosis not present

## 2016-08-08 DIAGNOSIS — Z6821 Body mass index (BMI) 21.0-21.9, adult: Secondary | ICD-10-CM | POA: Diagnosis not present

## 2016-08-08 DIAGNOSIS — Z1389 Encounter for screening for other disorder: Secondary | ICD-10-CM | POA: Diagnosis not present

## 2016-08-08 DIAGNOSIS — I10 Essential (primary) hypertension: Secondary | ICD-10-CM | POA: Diagnosis not present

## 2016-09-03 ENCOUNTER — Telehealth: Payer: Self-pay

## 2016-09-04 ENCOUNTER — Other Ambulatory Visit: Payer: Self-pay

## 2016-09-04 DIAGNOSIS — Z1211 Encounter for screening for malignant neoplasm of colon: Secondary | ICD-10-CM

## 2016-09-04 NOTE — Telephone Encounter (Signed)
Gastroenterology Pre-Procedure Review  Request Date: 09/03/2016 Requesting Physician: Dr. Hilma Favors  PATIENT REVIEW QUESTIONS: The patient responded to the following health history questions as indicated:    1. Diabetes Melitis: no 2. Joint replacements in the past 12 months: no 3. Major health problems in the past 3 months: no 4. Has an artificial valve or MVP: no 5. Has a defibrillator: no 6. Has been advised in past to take antibiotics in advance of a procedure like teeth cleaning: no 7. Family history of colon cancer: no  8. Alcohol Use: no 9. History of sleep apnea: no     MEDICATIONS & ALLERGIES:    Patient reports the following regarding taking any blood thinners:   Plavix? no Aspirin? no Coumadin? no  Patient confirms/reports the following medications:  Current Outpatient Prescriptions  Medication Sig Dispense Refill  . amLODipine (NORVASC) 10 MG tablet Take 10 mg by mouth daily.    . hydrochlorothiazide (HYDRODIURIL) 25 MG tablet Take 25 mg by mouth every other day.    . leflunomide (ARAVA) 10 MG tablet Take 10 mg by mouth daily.    Marland Kitchen losartan (COZAAR) 100 MG tablet Take 100 mg by mouth daily.    . metoprolol (LOPRESSOR) 100 MG tablet Take 100 mg by mouth daily.     . Omega-3 Fatty Acids (FISH OIL) 1200 MG CAPS Take 1,200 mg by mouth daily.    . predniSONE (DELTASONE) 5 MG tablet Take 5 mg by mouth as needed. Patient takes when arthritis is "active".     No current facility-administered medications for this visit.     Patient confirms/reports the following allergies:  Allergies  Allergen Reactions  . Sulfa Antibiotics Anaphylaxis    No orders of the defined types were placed in this encounter.   AUTHORIZATION INFORMATION Primary Insurance:   ID #:  Group #:  Pre-Cert / Auth required:  Pre-Cert / Auth #:   Secondary Insurance:   ID #:   Group #:  Pre-Cert / Auth required: Pre-Cert / Auth #:   SCHEDULE INFORMATION: Procedure has been scheduled as follows:   Date:  09/25/2016           Time:  8:30 AM Location: [facility]  This Gastroenterology Pre-Precedure Review Form is being routed to the following provider(s): R. Garfield Cornea, MD

## 2016-09-05 NOTE — Telephone Encounter (Signed)
Okay to schedule

## 2016-09-11 MED ORDER — PEG 3350-KCL-NA BICARB-NACL 420 G PO SOLR: 4000.0000 mL | ORAL | 0 refills | Status: AC

## 2016-09-11 NOTE — Telephone Encounter (Signed)
Rx sent to the pharmacy and instructions sent to pt.

## 2016-09-19 DIAGNOSIS — H11153 Pinguecula, bilateral: Secondary | ICD-10-CM | POA: Diagnosis not present

## 2016-09-19 DIAGNOSIS — H5203 Hypermetropia, bilateral: Secondary | ICD-10-CM | POA: Diagnosis not present

## 2016-09-19 DIAGNOSIS — H52223 Regular astigmatism, bilateral: Secondary | ICD-10-CM | POA: Diagnosis not present

## 2016-09-19 DIAGNOSIS — H35033 Hypertensive retinopathy, bilateral: Secondary | ICD-10-CM | POA: Diagnosis not present

## 2016-09-25 ENCOUNTER — Encounter (HOSPITAL_COMMUNITY): Payer: Self-pay | Admitting: *Deleted

## 2016-09-25 ENCOUNTER — Encounter (HOSPITAL_COMMUNITY)
Admission: RE | Disposition: A | Discharge status: Home / Self Care | Payer: Self-pay | Source: Ambulatory Visit | Attending: Internal Medicine

## 2016-09-25 ENCOUNTER — Ambulatory Visit (HOSPITAL_COMMUNITY)
Admission: RE | Admit: 2016-09-25 | Discharge: 2016-09-25 | Disposition: A | Discharge status: Home / Self Care | Payer: Medicare Other | Source: Ambulatory Visit | Attending: Internal Medicine | Admitting: Internal Medicine

## 2016-09-25 DIAGNOSIS — I10 Essential (primary) hypertension: Secondary | ICD-10-CM | POA: Insufficient documentation

## 2016-09-25 DIAGNOSIS — K5730 Diverticulosis of large intestine without perforation or abscess without bleeding: Secondary | ICD-10-CM | POA: Diagnosis not present

## 2016-09-25 DIAGNOSIS — Z1211 Encounter for screening for malignant neoplasm of colon: Secondary | ICD-10-CM | POA: Diagnosis not present

## 2016-09-25 DIAGNOSIS — Z1212 Encounter for screening for malignant neoplasm of rectum: Secondary | ICD-10-CM

## 2016-09-25 DIAGNOSIS — Z79899 Other long term (current) drug therapy: Secondary | ICD-10-CM | POA: Insufficient documentation

## 2016-09-25 DIAGNOSIS — F17210 Nicotine dependence, cigarettes, uncomplicated: Secondary | ICD-10-CM | POA: Diagnosis not present

## 2016-09-25 DIAGNOSIS — M069 Rheumatoid arthritis, unspecified: Secondary | ICD-10-CM | POA: Diagnosis not present

## 2016-09-25 DIAGNOSIS — D122 Benign neoplasm of ascending colon: Secondary | ICD-10-CM | POA: Diagnosis not present

## 2016-09-25 HISTORY — PX: POLYPECTOMY: SHX5525

## 2016-09-25 HISTORY — PX: COLONOSCOPY: SHX5424

## 2016-09-25 SURGERY — COLONOSCOPY
Anesthesia: Moderate Sedation

## 2016-09-25 MED ORDER — MEPERIDINE HCL 100 MG/ML IJ SOLN
INTRAMUSCULAR | Status: DC | PRN
  Administered 2016-09-25: 50 mg via INTRAVENOUS
  Administered 2016-09-25: 25 mg via INTRAVENOUS

## 2016-09-25 MED ORDER — ONDANSETRON HCL 4 MG/2ML IJ SOLN
INTRAMUSCULAR | Status: AC
  Filled 2016-09-25: qty 2

## 2016-09-25 MED ORDER — ONDANSETRON HCL 4 MG/2ML IJ SOLN
INTRAMUSCULAR | Status: DC | PRN
  Administered 2016-09-25: 4 mg via INTRAVENOUS

## 2016-09-25 MED ORDER — MIDAZOLAM HCL 5 MG/5ML IJ SOLN
INTRAMUSCULAR | Status: AC
  Filled 2016-09-25: qty 10

## 2016-09-25 MED ORDER — SODIUM CHLORIDE 0.9 % IV SOLN
INTRAVENOUS | Status: DC
  Administered 2016-09-25: 08:00:00 via INTRAVENOUS

## 2016-09-25 MED ORDER — STERILE WATER FOR IRRIGATION IR SOLN
Status: DC | PRN
  Administered 2016-09-25: 09:00:00

## 2016-09-25 MED ORDER — MEPERIDINE HCL 100 MG/ML IJ SOLN
INTRAMUSCULAR | Status: AC
  Filled 2016-09-25: qty 2

## 2016-09-25 MED ORDER — MIDAZOLAM HCL 5 MG/5ML IJ SOLN
INTRAMUSCULAR | Status: DC | PRN
  Administered 2016-09-25 (×2): 1 mg via INTRAVENOUS
  Administered 2016-09-25: 2 mg via INTRAVENOUS

## 2016-09-25 NOTE — Op Note (Signed)
[facility] Patient Name: Isabel Woods Procedure Date: 09/25/2016 8:29 AM MRN: [id number] Date of Birth: 11-09-1951 Attending MD: Norvel Richards , MD CSN: [id number] Age: 65 Admit Type: Outpatient Procedure:                Colonoscopy Indications:              Screening for colorectal malignant neoplasm Providers:                Norvel Richards, MD, Jeanann Lewandowsky. Sharon Seller, RN,                            Purcell Nails. [place], [employer] Referring MD:              Medicines:                Midazolam 4 mg IV, Meperidine 75 mg IV, Ondansetron                            4 mg IV Complications:            No immediate complications. Estimated Blood Loss:     Estimated blood loss was minimal. Procedure:                Pre-Anesthesia Assessment:                           - Prior to the procedure, a History and Physical                            was performed, and patient medications and                            allergies were reviewed. The patient's tolerance of                            previous anesthesia was also reviewed. The risks                            and benefits of the procedure and the sedation                            options and risks were discussed with the patient.                            All questions were answered, and informed consent                            was obtained. Prior Anticoagulants: The patient has                            taken no previous anticoagulant or antiplatelet                            agents. ASA Grade Assessment: II - A patient with  mild systemic disease. After reviewing the risks                            and benefits, the patient was deemed in                            satisfactory condition to undergo the procedure.                           After obtaining informed consent, the colonoscope                            was passed under direct vision. Throughout the   procedure, the patient's blood pressure, pulse, and                            oxygen saturations were monitored continuously. The                            [id number] scope was introduced through                            the anus and advanced to the the cecum, identified                            by appendiceal orifice and ileocecal valve. The                            colonoscopy was performed without difficulty. The                            patient tolerated the procedure well. The quality                            of the bowel preparation was adequate. The entire                            colon was visualized. The ileocecal valve,                            appendiceal orifice, and rectum were photographed. Scope In: 8:50:17 AM Scope Out: 9:10:56 AM Scope Withdrawal Time: 0 hours 15 minutes 43 seconds  Total Procedure Duration: 0 hours 20 minutes 39 seconds  Findings:      The perianal and digital rectal examinations were normal.      Multiple small and large-mouthed diverticula were found in the entire       colon.      (1) 3 mm polyp was found in the ascending colon. The polyp was       semi-pedunculated. The polyp was removed with a cold snare. Resection       was complete, and retrieval was complete. No biopsies or other specimens       were collected for this exam.      The exam was otherwise without abnormality on direct and retroflexion  views. Impression:               - Moderate diverticulosis in the entire examined                            colon.                           - One 3 mm polyp in the ascending colon, removed                            /ablated with a cold snare. No specimens collected.                           - The examination was otherwise normal on direct                            and retroflexion views. Moderate Sedation:      Moderate (conscious) sedation was administered by the endoscopy nurse       and supervised by the  endoscopist. The following parameters were       monitored: oxygen saturation, heart rate, blood pressure, respiratory       rate, EKG, adequacy of pulmonary ventilation, and response to care.       Total physician intraservice time was 29 minutes. Recommendation:           - Patient has a contact number available for                            emergencies. The signs and symptoms of potential                            delayed complications were discussed with the                            patient. Return to normal activities tomorrow.                            Written discharge instructions were provided to the                            patient.                           - Resume previous diet.                           - Continue present medications.                           - Repeat colonoscopy in 7 years for surveillance.                           - Return to GI office PRN. Procedure Code(s):        --- Professional ---  [phone], Colonoscopy, flexible; with removal of                            tumor(s), polyp(s), or other lesion(s) by snare                            technique                           99152, Moderate sedation services provided by the                            same physician or other qualified health care                            professional performing the diagnostic or                            therapeutic service that the sedation supports,                            requiring the presence of an independent trained                            observer to assist in the monitoring of the                            patient's level of consciousness and physiological                            status; initial 15 minutes of intraservice time,                            patient age 6 years or older                           [phone], Moderate sedation services; each additional                            15 minutes intraservice time Diagnosis  Code(s):        --- Professional ---                           Z12.11, Encounter for screening for malignant                            neoplasm of colon                           D12.2, Benign neoplasm of ascending colon                           K57.30, Diverticulosis of large intestine without                            perforation or  abscess without bleeding CPT copyright 2016 [employer]. All rights reserved. The codes documented in this report are preliminary and upon coder review may  be revised to meet current compliance requirements. Cristopher Estimable. Damonie Ellenwood, MD Norvel Richards, MD 09/25/2016 9:22:48 AM This report has been signed electronically. Number of Addenda: 0

## 2016-09-25 NOTE — Discharge Instructions (Signed)
Colonoscopy Discharge Instructions  Read the instructions outlined below and refer to this sheet in the next few weeks. These discharge instructions provide you with general information on caring for yourself after you leave the hospital. Your doctor may also give you specific instructions. While your treatment has been planned according to the most current medical practices available, unavoidable complications occasionally occur. If you have any problems or questions after discharge, call Dr. Gala Romney at [phone]. ACTIVITY  You may resume your regular activity, but move at a slower pace for the next 24 hours.   Take frequent rest periods for the next 24 hours.   Walking will help get rid of the air and reduce the bloated feeling in your belly (abdomen).   No driving for 24 hours (because of the medicine (anesthesia) used during the test).    Do not sign any important legal documents or operate any machinery for 24 hours (because of the anesthesia used during the test).  NUTRITION  Drink plenty of fluids.   You may resume your normal diet as instructed by your doctor.   Begin with a light meal and progress to your normal diet. Heavy or fried foods are harder to digest and may make you feel sick to your stomach (nauseated).   Avoid alcoholic beverages for 24 hours or as instructed.  MEDICATIONS  You may resume your normal medications unless your doctor tells you otherwise.  WHAT YOU CAN EXPECT TODAY  Some feelings of bloating in the abdomen.   Passage of more gas than usual.   Spotting of blood in your stool or on the toilet paper.  IF YOU HAD POLYPS REMOVED DURING THE COLONOSCOPY:  No aspirin products for 7 days or as instructed.   No alcohol for 7 days or as instructed.   Eat a soft diet for the next 24 hours.  FINDING OUT THE RESULTS OF YOUR TEST Not all test results are available during your visit. If your test results are not back during the visit, make an appointment  with your caregiver to find out the results. Do not assume everything is normal if you have not heard from your caregiver or the medical facility. It is important for you to follow up on all of your test results.  SEEK IMMEDIATE MEDICAL ATTENTION IF:  You have more than a spotting of blood in your stool.   Your belly is swollen (abdominal distention).   You are nauseated or vomiting.   You have a temperature over 101.   You have abdominal pain or discomfort that is severe or gets worse throughout the day.     Colon diverticulosis and polyp information provided  Repeat colonoscopy in 7 years  Colon Polyps Polyps are lumps of extra tissue growing inside the body. Polyps can grow in the large intestine (colon). Most colon polyps are noncancerous (benign). However, some colon polyps can become cancerous over time. Polyps that are larger than a pea may be harmful. To be safe, caregivers remove and test all polyps. CAUSES  Polyps form when mutations in the genes cause your cells to grow and divide even though no more tissue is needed. RISK FACTORS There are a number of risk factors that can increase your chances of getting colon polyps. They include:  Being older than 50 years.  Family history of colon polyps or colon cancer.  Long-term colon diseases, such as colitis or Crohn disease.  Being overweight.  Smoking.  Being inactive.  Drinking too much alcohol. SYMPTOMS  Most small polyps do not cause symptoms. If symptoms are present, they may include:  Blood in the stool. The stool may look dark red or black.  Constipation or diarrhea that lasts longer than 1 week. DIAGNOSIS People often do not know they have polyps until their caregiver finds them during a regular checkup. Your caregiver can use 4 tests to check for polyps:  Digital rectal exam. The caregiver wears gloves and feels inside the rectum. This test would find polyps only in the rectum.  Barium enema. The  caregiver puts a liquid called barium into your rectum before taking X-rays of your colon. Barium makes your colon look white. Polyps are dark, so they are easy to see in the X-ray pictures.  Sigmoidoscopy. A thin, flexible tube (sigmoidoscope) is placed into your rectum. The sigmoidoscope has a light and tiny camera in it. The caregiver uses the sigmoidoscope to look at the last third of your colon.  Colonoscopy. This test is like sigmoidoscopy, but the caregiver looks at the entire colon. This is the most common method for finding and removing polyps. TREATMENT  Any polyps will be removed during a sigmoidoscopy or colonoscopy. The polyps are then tested for cancer. PREVENTION  To help lower your risk of getting more colon polyps:  Eat plenty of fruits and vegetables. Avoid eating fatty foods.  Do not smoke.  Avoid drinking alcohol.  Exercise every day.  Lose weight if recommended by your caregiver.  Eat plenty of calcium and folate. Foods that are rich in calcium include milk, cheese, and broccoli. Foods that are rich in folate include chickpeas, kidney beans, and spinach. HOME CARE INSTRUCTIONS Keep all follow-up appointments as directed by your caregiver. You may need periodic exams to check for polyps. SEEK MEDICAL CARE IF: You notice bleeding during a bowel movement.   This information is not intended to replace advice given to you by your health care provider. Make sure you discuss any questions you have with your health care provider.   Document Released: 08/28/2004 Document Revised: 12/23/2014 Document Reviewed: 02/11/2012 Elsevier Interactive Patient Education 2016 [employer].    Diverticulosis Diverticulosis is the condition that develops when small pouches (diverticula) form in the wall of your colon. Your colon, or large intestine, is where water is absorbed and stool is formed. The pouches form when the inside layer of your colon pushes through weak spots in the  outer layers of your colon. CAUSES  No one knows exactly what causes diverticulosis. RISK FACTORS  Being older than 52. Your risk for this condition increases with age. Diverticulosis is rare in people younger than 40 years. By age 35, almost everyone has it.  Eating a low-fiber diet.  Being frequently constipated.  Being overweight.  Not getting enough exercise.  Smoking.  Taking over-the-counter pain medicines, like aspirin and ibuprofen. SYMPTOMS  Most people with diverticulosis do not have symptoms. DIAGNOSIS  Because diverticulosis often has no symptoms, health care providers often discover the condition during an exam for other colon problems. In many cases, a health care provider will diagnose diverticulosis while using a flexible scope to examine the colon (colonoscopy). TREATMENT  If you have never developed an infection related to diverticulosis, you may not need treatment. If you have had an infection before, treatment may include:  Eating more fruits, vegetables, and grains.  Taking a fiber supplement.  Taking a live bacteria supplement (probiotic).  Taking medicine to relax your colon. HOME CARE INSTRUCTIONS   Drink at least 6-8  glasses of water each day to prevent constipation.  Try not to strain when you have a bowel movement.  Keep all follow-up appointments. If you have had an infection before:  Increase the fiber in your diet as directed by your health care provider or dietitian.  Take a dietary fiber supplement if your health care provider approves.  Only take medicines as directed by your health care provider. SEEK MEDICAL CARE IF:   You have abdominal pain.  You have bloating.  You have cramps.  You have not gone to the bathroom in 3 days. SEEK IMMEDIATE MEDICAL CARE IF:   Your pain gets worse.  Yourbloating becomes very bad.  You have a fever or chills, and your symptoms suddenly get worse.  You begin vomiting.  You have bowel  movements that are bloody or black. MAKE SURE YOU:  Understand these instructions.  Will watch your condition.  Will get help right away if you are not doing well or get worse.   This information is not intended to replace advice given to you by your health care provider. Make sure you discuss any questions you have with your health care provider.   Document Released: 08/29/2004 Document Revised: 12/07/2013 Document Reviewed: 10/27/2013 Elsevier Interactive Patient Education [employer].

## 2016-09-25 NOTE — H&P (Signed)
@LOGO @   Primary Care Physician:  Purvis Kilts, MD Primary Gastroenterologist:  Dr. Gala Romney  Pre-Procedure History & Physical: HPI:  Isabel Woods is a 65 y.o. female is here for a screening colonoscopy. Negative colonoscopy 2006. No family history of colon cancer. No bowel symptoms.  Past Medical History:  Diagnosis Date  . Arthritis    RA  . Hypertension   . Paresthesia of arm     Past Surgical History:  Procedure Laterality Date  . ANTERIOR CERVICAL DECOMP/DISCECTOMY FUSION  02/25/2012   Procedure: ANTERIOR CERVICAL DECOMPRESSION/DISCECTOMY FUSION 2 LEVELS;  Surgeon: Faythe Ghee, MD;  Location: [facility];  Service: Neurosurgery;  Laterality: N/A;  Anterior Cervical Decompression/Discectomy Fusion Cervical Five-Six, Cervical Six-Seven  . BREAST SURGERY     CYST L   . COLONOSCOPY    . INSERTION OF MESH N/A 02/09/2014   Procedure: INSERTION OF MESH;  Surgeon: Jamesetta So, MD;  Location: [facility];  Service: General;  Laterality: N/A;  . VENTRAL HERNIA REPAIR N/A 02/09/2014   Procedure: LEFT INGUINAL HERNIORRHAPHY WITH MESH;  Surgeon: Jamesetta So, MD;  Location: [facility];  Service: General;  Laterality: N/A;    Prior to Admission medications   Medication Sig Start Date End Date Taking? Authorizing Provider  amLODipine (NORVASC) 10 MG tablet Take 10 mg by mouth daily.   Yes Historical Provider, MD  hydrochlorothiazide (HYDRODIURIL) 25 MG tablet Take 25 mg by mouth every other day.   Yes Historical Provider, MD  leflunomide (ARAVA) 10 MG tablet Take 10 mg by mouth daily.   Yes Historical Provider, MD  losartan (COZAAR) 100 MG tablet Take 100 mg by mouth daily.   Yes Historical Provider, MD  metoprolol (LOPRESSOR) 100 MG tablet Take 100 mg by mouth daily.    Yes Historical Provider, MD  Omega-3 Fatty Acids (FISH OIL) 1200 MG CAPS Take 1,200 mg by mouth daily.   Yes Historical Provider, MD  polyethylene glycol-electrolytes (TRILYTE) 420 g solution Take 4,000 mLs by mouth  as directed. 09/11/16  Yes Daneil Dolin, MD  predniSONE (DELTASONE) 5 MG tablet Take 5 mg by mouth as needed. Patient takes when arthritis is "active".    Historical Provider, MD    Allergies as of 09/04/2016 - Review Complete 09/03/2016  Allergen Reaction Noted  . Sulfa antibiotics Anaphylaxis 02/12/2012    Family History  Problem Relation Age of Onset  . Colon cancer Neg Hx     Social History   Social History  . Marital status: Married    Spouse name: N/A  . Number of children: N/A  . Years of education: N/A   Occupational History  . Not on file.   Social History Main Topics  . Smoking status: Current Every Day Smoker    Packs/day: 0.50    Years: 20.00    Types: Cigarettes  . Smokeless tobacco: Never Used  . Alcohol use No  . Drug use: No  . Sexual activity: Not on file   Other Topics Concern  . Not on file   Social History Narrative  . No narrative on file    Review of Systems: See HPI, otherwise negative ROS  Physical Exam: BP (!) 157/96   Pulse 60   Temp 97.7 F (36.5 C) (Oral)   Resp 12   Ht 5\' 3"  (1.6 m)   Wt 125 lb (56.7 kg)   SpO2 100%   BMI 22.14 kg/m  General:   Alert,  Well-developed, well-nourished, pleasant and  cooperative in NAD Head:  Normocephalic and atraumatic. Lungs:  Clear throughout to auscultation.   No wheezes, crackles, or rhonchi. No acute distress. Heart:  Regular rate and rhythm; no murmurs, clicks, rubs,  or gallops. Abdomen:  Soft, nontender and nondistended. No masses, hepatosplenomegaly or hernias noted. Normal bowel sounds, without guarding, and without rebound.    Impression/Plan: Isabel Woods is now here to undergo a screening colonoscopy.  Average risk screening examination  Risks, benefits, limitations, imponderables and alternatives regarding colonoscopy have been reviewed with the patient. Questions have been answered. All parties agreeable.     Notice:  This dictation was prepared with Dragon dictation  along with smaller phrase technology. Any transcriptional errors that result from this process are unintentional and may not be corrected upon review.

## 2016-10-03 ENCOUNTER — Encounter (HOSPITAL_COMMUNITY): Payer: Self-pay | Admitting: Internal Medicine

## 2016-10-24 DIAGNOSIS — M0589 Other rheumatoid arthritis with rheumatoid factor of multiple sites: Secondary | ICD-10-CM | POA: Diagnosis not present

## 2016-10-24 DIAGNOSIS — M8589 Other specified disorders of bone density and structure, multiple sites: Secondary | ICD-10-CM | POA: Diagnosis not present

## 2016-10-24 DIAGNOSIS — Z23 Encounter for immunization: Secondary | ICD-10-CM | POA: Diagnosis not present

## 2016-10-24 DIAGNOSIS — M150 Primary generalized (osteo)arthritis: Secondary | ICD-10-CM | POA: Diagnosis not present

## 2016-10-24 DIAGNOSIS — M5030 Other cervical disc degeneration, unspecified cervical region: Secondary | ICD-10-CM | POA: Diagnosis not present

## 2017-01-30 DIAGNOSIS — M7989 Other specified soft tissue disorders: Secondary | ICD-10-CM | POA: Diagnosis not present

## 2017-01-30 DIAGNOSIS — M5030 Other cervical disc degeneration, unspecified cervical region: Secondary | ICD-10-CM | POA: Diagnosis not present

## 2017-01-30 DIAGNOSIS — M8589 Other specified disorders of bone density and structure, multiple sites: Secondary | ICD-10-CM | POA: Diagnosis not present

## 2017-01-30 DIAGNOSIS — M0589 Other rheumatoid arthritis with rheumatoid factor of multiple sites: Secondary | ICD-10-CM | POA: Diagnosis not present

## 2017-01-30 DIAGNOSIS — Z6821 Body mass index (BMI) 21.0-21.9, adult: Secondary | ICD-10-CM | POA: Diagnosis not present

## 2017-01-30 DIAGNOSIS — M150 Primary generalized (osteo)arthritis: Secondary | ICD-10-CM | POA: Diagnosis not present

## 2017-04-23 DIAGNOSIS — M5030 Other cervical disc degeneration, unspecified cervical region: Secondary | ICD-10-CM | POA: Diagnosis not present

## 2017-04-23 DIAGNOSIS — M069 Rheumatoid arthritis, unspecified: Secondary | ICD-10-CM | POA: Diagnosis not present

## 2017-04-23 DIAGNOSIS — E785 Hyperlipidemia, unspecified: Secondary | ICD-10-CM | POA: Diagnosis not present

## 2017-04-23 DIAGNOSIS — I10 Essential (primary) hypertension: Secondary | ICD-10-CM | POA: Diagnosis not present

## 2017-04-23 DIAGNOSIS — Z1389 Encounter for screening for other disorder: Secondary | ICD-10-CM | POA: Diagnosis not present

## 2017-04-23 DIAGNOSIS — Z6822 Body mass index (BMI) 22.0-22.9, adult: Secondary | ICD-10-CM | POA: Diagnosis not present

## 2017-04-24 ENCOUNTER — Other Ambulatory Visit (HOSPITAL_COMMUNITY): Payer: Self-pay | Admitting: Family Medicine

## 2017-04-24 DIAGNOSIS — Z87891 Personal history of nicotine dependence: Secondary | ICD-10-CM

## 2017-04-24 DIAGNOSIS — Z122 Encounter for screening for malignant neoplasm of respiratory organs: Secondary | ICD-10-CM

## 2017-04-30 DIAGNOSIS — M150 Primary generalized (osteo)arthritis: Secondary | ICD-10-CM | POA: Diagnosis not present

## 2017-04-30 DIAGNOSIS — M0589 Other rheumatoid arthritis with rheumatoid factor of multiple sites: Secondary | ICD-10-CM | POA: Diagnosis not present

## 2017-04-30 DIAGNOSIS — Z6821 Body mass index (BMI) 21.0-21.9, adult: Secondary | ICD-10-CM | POA: Diagnosis not present

## 2017-04-30 DIAGNOSIS — M5030 Other cervical disc degeneration, unspecified cervical region: Secondary | ICD-10-CM | POA: Diagnosis not present

## 2017-04-30 DIAGNOSIS — M8589 Other specified disorders of bone density and structure, multiple sites: Secondary | ICD-10-CM | POA: Diagnosis not present

## 2017-05-05 ENCOUNTER — Other Ambulatory Visit (HOSPITAL_COMMUNITY): Payer: Self-pay | Admitting: Family Medicine

## 2017-05-05 ENCOUNTER — Ambulatory Visit (HOSPITAL_COMMUNITY)
Admission: RE | Admit: 2017-05-05 | Discharge: 2017-05-05 | Disposition: A | Discharge status: Home / Self Care | Payer: Medicare Other | Source: Ambulatory Visit | Attending: Family Medicine | Admitting: Family Medicine

## 2017-05-05 DIAGNOSIS — Z1231 Encounter for screening mammogram for malignant neoplasm of breast: Secondary | ICD-10-CM

## 2017-05-05 DIAGNOSIS — Z122 Encounter for screening for malignant neoplasm of respiratory organs: Secondary | ICD-10-CM | POA: Insufficient documentation

## 2017-05-05 DIAGNOSIS — F17200 Nicotine dependence, unspecified, uncomplicated: Secondary | ICD-10-CM | POA: Diagnosis not present

## 2017-05-05 DIAGNOSIS — F17210 Nicotine dependence, cigarettes, uncomplicated: Secondary | ICD-10-CM | POA: Insufficient documentation

## 2017-05-05 DIAGNOSIS — Z87891 Personal history of nicotine dependence: Secondary | ICD-10-CM

## 2017-05-15 ENCOUNTER — Ambulatory Visit (HOSPITAL_COMMUNITY)
Admission: RE | Admit: 2017-05-15 | Discharge: 2017-05-15 | Disposition: A | Discharge status: Home / Self Care | Payer: Medicare Other | Source: Ambulatory Visit | Attending: Family Medicine | Admitting: Family Medicine

## 2017-05-15 DIAGNOSIS — Z1231 Encounter for screening mammogram for malignant neoplasm of breast: Secondary | ICD-10-CM | POA: Insufficient documentation

## 2017-06-16 DIAGNOSIS — M278 Other specified diseases of jaws: Secondary | ICD-10-CM | POA: Diagnosis not present

## 2017-06-25 DIAGNOSIS — M278 Other specified diseases of jaws: Secondary | ICD-10-CM | POA: Diagnosis not present

## 2017-07-29 DIAGNOSIS — Z6820 Body mass index (BMI) 20.0-20.9, adult: Secondary | ICD-10-CM | POA: Diagnosis not present

## 2017-07-29 DIAGNOSIS — M8589 Other specified disorders of bone density and structure, multiple sites: Secondary | ICD-10-CM | POA: Diagnosis not present

## 2017-07-29 DIAGNOSIS — M0589 Other rheumatoid arthritis with rheumatoid factor of multiple sites: Secondary | ICD-10-CM | POA: Diagnosis not present

## 2017-07-29 DIAGNOSIS — M5030 Other cervical disc degeneration, unspecified cervical region: Secondary | ICD-10-CM | POA: Diagnosis not present

## 2017-07-29 DIAGNOSIS — M150 Primary generalized (osteo)arthritis: Secondary | ICD-10-CM | POA: Diagnosis not present

## 2017-09-11 DIAGNOSIS — E559 Vitamin D deficiency, unspecified: Secondary | ICD-10-CM | POA: Diagnosis not present

## 2017-09-11 DIAGNOSIS — Z6821 Body mass index (BMI) 21.0-21.9, adult: Secondary | ICD-10-CM | POA: Diagnosis not present

## 2017-09-11 DIAGNOSIS — M069 Rheumatoid arthritis, unspecified: Secondary | ICD-10-CM | POA: Diagnosis not present

## 2017-09-11 DIAGNOSIS — Z23 Encounter for immunization: Secondary | ICD-10-CM | POA: Diagnosis not present

## 2017-09-11 DIAGNOSIS — E785 Hyperlipidemia, unspecified: Secondary | ICD-10-CM | POA: Diagnosis not present

## 2017-09-11 DIAGNOSIS — I10 Essential (primary) hypertension: Secondary | ICD-10-CM | POA: Diagnosis not present

## 2017-10-06 DIAGNOSIS — Z6820 Body mass index (BMI) 20.0-20.9, adult: Secondary | ICD-10-CM | POA: Diagnosis not present

## 2017-10-06 DIAGNOSIS — M5030 Other cervical disc degeneration, unspecified cervical region: Secondary | ICD-10-CM | POA: Diagnosis not present

## 2017-10-06 DIAGNOSIS — M0589 Other rheumatoid arthritis with rheumatoid factor of multiple sites: Secondary | ICD-10-CM | POA: Diagnosis not present

## 2017-10-06 DIAGNOSIS — M8589 Other specified disorders of bone density and structure, multiple sites: Secondary | ICD-10-CM | POA: Diagnosis not present

## 2017-10-06 DIAGNOSIS — M150 Primary generalized (osteo)arthritis: Secondary | ICD-10-CM | POA: Diagnosis not present

## 2017-12-26 DIAGNOSIS — Z6820 Body mass index (BMI) 20.0-20.9, adult: Secondary | ICD-10-CM | POA: Diagnosis not present

## 2017-12-26 DIAGNOSIS — J069 Acute upper respiratory infection, unspecified: Secondary | ICD-10-CM | POA: Diagnosis not present

## 2017-12-26 DIAGNOSIS — E782 Mixed hyperlipidemia: Secondary | ICD-10-CM | POA: Diagnosis not present

## 2017-12-26 DIAGNOSIS — E559 Vitamin D deficiency, unspecified: Secondary | ICD-10-CM | POA: Diagnosis not present

## 2017-12-26 DIAGNOSIS — E785 Hyperlipidemia, unspecified: Secondary | ICD-10-CM | POA: Diagnosis not present

## 2017-12-26 DIAGNOSIS — I10 Essential (primary) hypertension: Secondary | ICD-10-CM | POA: Diagnosis not present

## 2017-12-26 DIAGNOSIS — Z1389 Encounter for screening for other disorder: Secondary | ICD-10-CM | POA: Diagnosis not present

## 2017-12-26 DIAGNOSIS — M069 Rheumatoid arthritis, unspecified: Secondary | ICD-10-CM | POA: Diagnosis not present

## 2018-01-13 DIAGNOSIS — M150 Primary generalized (osteo)arthritis: Secondary | ICD-10-CM | POA: Diagnosis not present

## 2018-01-13 DIAGNOSIS — M5030 Other cervical disc degeneration, unspecified cervical region: Secondary | ICD-10-CM | POA: Diagnosis not present

## 2018-01-13 DIAGNOSIS — Z6820 Body mass index (BMI) 20.0-20.9, adult: Secondary | ICD-10-CM | POA: Diagnosis not present

## 2018-01-13 DIAGNOSIS — M8589 Other specified disorders of bone density and structure, multiple sites: Secondary | ICD-10-CM | POA: Diagnosis not present

## 2018-01-13 DIAGNOSIS — M0589 Other rheumatoid arthritis with rheumatoid factor of multiple sites: Secondary | ICD-10-CM | POA: Diagnosis not present

## 2018-03-05 ENCOUNTER — Other Ambulatory Visit: Payer: Self-pay | Admitting: Physician Assistant

## 2018-03-05 DIAGNOSIS — M8580 Other specified disorders of bone density and structure, unspecified site: Secondary | ICD-10-CM

## 2018-04-13 DIAGNOSIS — Z6820 Body mass index (BMI) 20.0-20.9, adult: Secondary | ICD-10-CM | POA: Diagnosis not present

## 2018-04-13 DIAGNOSIS — M0589 Other rheumatoid arthritis with rheumatoid factor of multiple sites: Secondary | ICD-10-CM | POA: Diagnosis not present

## 2018-04-13 DIAGNOSIS — M150 Primary generalized (osteo)arthritis: Secondary | ICD-10-CM | POA: Diagnosis not present

## 2018-04-13 DIAGNOSIS — M8589 Other specified disorders of bone density and structure, multiple sites: Secondary | ICD-10-CM | POA: Diagnosis not present

## 2018-04-13 DIAGNOSIS — M5030 Other cervical disc degeneration, unspecified cervical region: Secondary | ICD-10-CM | POA: Diagnosis not present

## 2018-04-27 ENCOUNTER — Ambulatory Visit
Admission: RE | Admit: 2018-04-27 | Discharge: 2018-04-27 | Disposition: A | Discharge status: Home / Self Care | Payer: Medicare HMO | Source: Ambulatory Visit | Attending: Physician Assistant | Admitting: Physician Assistant

## 2018-04-27 DIAGNOSIS — M8580 Other specified disorders of bone density and structure, unspecified site: Secondary | ICD-10-CM

## 2018-05-04 DIAGNOSIS — Z720 Tobacco use: Secondary | ICD-10-CM | POA: Diagnosis not present

## 2018-05-04 DIAGNOSIS — Z800 Family history of malignant neoplasm of digestive organs: Secondary | ICD-10-CM | POA: Diagnosis not present

## 2018-05-04 DIAGNOSIS — Z8249 Family history of ischemic heart disease and other diseases of the circulatory system: Secondary | ICD-10-CM | POA: Diagnosis not present

## 2018-05-04 DIAGNOSIS — Z803 Family history of malignant neoplasm of breast: Secondary | ICD-10-CM | POA: Diagnosis not present

## 2018-05-04 DIAGNOSIS — Z7952 Long term (current) use of systemic steroids: Secondary | ICD-10-CM | POA: Diagnosis not present

## 2018-05-04 DIAGNOSIS — M069 Rheumatoid arthritis, unspecified: Secondary | ICD-10-CM | POA: Diagnosis not present

## 2018-05-04 DIAGNOSIS — G8929 Other chronic pain: Secondary | ICD-10-CM | POA: Diagnosis not present

## 2018-05-04 DIAGNOSIS — Z823 Family history of stroke: Secondary | ICD-10-CM | POA: Diagnosis not present

## 2018-05-04 DIAGNOSIS — I10 Essential (primary) hypertension: Secondary | ICD-10-CM | POA: Diagnosis not present

## 2018-05-04 DIAGNOSIS — J302 Other seasonal allergic rhinitis: Secondary | ICD-10-CM | POA: Diagnosis not present

## 2018-05-06 DIAGNOSIS — Z0100 Encounter for examination of eyes and vision without abnormal findings: Secondary | ICD-10-CM | POA: Diagnosis not present

## 2018-05-06 DIAGNOSIS — I10 Essential (primary) hypertension: Secondary | ICD-10-CM | POA: Diagnosis not present

## 2018-05-06 DIAGNOSIS — H5200 Hypermetropia, unspecified eye: Secondary | ICD-10-CM | POA: Diagnosis not present

## 2018-05-07 ENCOUNTER — Ambulatory Visit (HOSPITAL_COMMUNITY)
Admission: RE | Admit: 2018-05-07 | Discharge: 2018-05-07 | Disposition: A | Discharge status: Home / Self Care | Payer: Medicare HMO | Source: Ambulatory Visit | Attending: Physician Assistant | Admitting: Physician Assistant

## 2018-05-07 ENCOUNTER — Other Ambulatory Visit (HOSPITAL_COMMUNITY): Payer: Self-pay | Admitting: Family Medicine

## 2018-05-07 ENCOUNTER — Encounter (HOSPITAL_COMMUNITY): Payer: Self-pay

## 2018-05-07 ENCOUNTER — Other Ambulatory Visit (HOSPITAL_COMMUNITY): Payer: Self-pay | Admitting: Physician Assistant

## 2018-05-07 DIAGNOSIS — M8589 Other specified disorders of bone density and structure, multiple sites: Secondary | ICD-10-CM | POA: Insufficient documentation

## 2018-05-07 DIAGNOSIS — Z1231 Encounter for screening mammogram for malignant neoplasm of breast: Secondary | ICD-10-CM

## 2018-05-07 DIAGNOSIS — M8588 Other specified disorders of bone density and structure, other site: Secondary | ICD-10-CM | POA: Diagnosis not present

## 2018-05-18 ENCOUNTER — Ambulatory Visit (HOSPITAL_COMMUNITY)
Admission: RE | Admit: 2018-05-18 | Discharge: 2018-05-18 | Disposition: A | Discharge status: Home / Self Care | Payer: Medicare HMO | Source: Ambulatory Visit | Attending: Family Medicine | Admitting: Family Medicine

## 2018-05-18 ENCOUNTER — Encounter (HOSPITAL_COMMUNITY): Payer: Self-pay

## 2018-05-18 DIAGNOSIS — Z1231 Encounter for screening mammogram for malignant neoplasm of breast: Secondary | ICD-10-CM | POA: Diagnosis not present

## 2018-05-28 DIAGNOSIS — E559 Vitamin D deficiency, unspecified: Secondary | ICD-10-CM | POA: Diagnosis not present

## 2018-05-28 DIAGNOSIS — I10 Essential (primary) hypertension: Secondary | ICD-10-CM | POA: Diagnosis not present

## 2018-05-28 DIAGNOSIS — Z0001 Encounter for general adult medical examination with abnormal findings: Secondary | ICD-10-CM | POA: Diagnosis not present

## 2018-05-28 DIAGNOSIS — M069 Rheumatoid arthritis, unspecified: Secondary | ICD-10-CM | POA: Diagnosis not present

## 2018-05-28 DIAGNOSIS — E785 Hyperlipidemia, unspecified: Secondary | ICD-10-CM | POA: Diagnosis not present

## 2018-05-28 DIAGNOSIS — M5030 Other cervical disc degeneration, unspecified cervical region: Secondary | ICD-10-CM | POA: Diagnosis not present

## 2018-05-28 DIAGNOSIS — Z6820 Body mass index (BMI) 20.0-20.9, adult: Secondary | ICD-10-CM | POA: Diagnosis not present

## 2018-06-12 ENCOUNTER — Other Ambulatory Visit (HOSPITAL_COMMUNITY): Payer: Self-pay | Admitting: Family Medicine

## 2018-06-12 DIAGNOSIS — F17210 Nicotine dependence, cigarettes, uncomplicated: Secondary | ICD-10-CM

## 2018-07-01 ENCOUNTER — Ambulatory Visit (HOSPITAL_COMMUNITY)
Admission: RE | Admit: 2018-07-01 | Discharge: 2018-07-01 | Disposition: A | Discharge status: Home / Self Care | Payer: Medicare HMO | Source: Ambulatory Visit | Attending: Family Medicine | Admitting: Family Medicine

## 2018-07-01 DIAGNOSIS — R911 Solitary pulmonary nodule: Secondary | ICD-10-CM | POA: Diagnosis not present

## 2018-07-01 DIAGNOSIS — I700 Atherosclerosis of aorta: Secondary | ICD-10-CM | POA: Insufficient documentation

## 2018-07-01 DIAGNOSIS — F17210 Nicotine dependence, cigarettes, uncomplicated: Secondary | ICD-10-CM | POA: Insufficient documentation

## 2018-07-01 DIAGNOSIS — I2510 Atherosclerotic heart disease of native coronary artery without angina pectoris: Secondary | ICD-10-CM | POA: Diagnosis not present

## 2018-07-01 DIAGNOSIS — R69 Illness, unspecified: Secondary | ICD-10-CM | POA: Diagnosis not present

## 2018-07-01 DIAGNOSIS — Z122 Encounter for screening for malignant neoplasm of respiratory organs: Secondary | ICD-10-CM | POA: Insufficient documentation

## 2018-07-01 DIAGNOSIS — J439 Emphysema, unspecified: Secondary | ICD-10-CM | POA: Insufficient documentation

## 2018-07-13 DIAGNOSIS — M0589 Other rheumatoid arthritis with rheumatoid factor of multiple sites: Secondary | ICD-10-CM | POA: Diagnosis not present

## 2018-07-13 DIAGNOSIS — Z681 Body mass index (BMI) 19 or less, adult: Secondary | ICD-10-CM | POA: Diagnosis not present

## 2018-07-13 DIAGNOSIS — M5030 Other cervical disc degeneration, unspecified cervical region: Secondary | ICD-10-CM | POA: Diagnosis not present

## 2018-07-13 DIAGNOSIS — M8589 Other specified disorders of bone density and structure, multiple sites: Secondary | ICD-10-CM | POA: Diagnosis not present

## 2018-07-13 DIAGNOSIS — M150 Primary generalized (osteo)arthritis: Secondary | ICD-10-CM | POA: Diagnosis not present

## 2018-10-05 DIAGNOSIS — Z681 Body mass index (BMI) 19 or less, adult: Secondary | ICD-10-CM | POA: Diagnosis not present

## 2018-10-05 DIAGNOSIS — M069 Rheumatoid arthritis, unspecified: Secondary | ICD-10-CM | POA: Diagnosis not present

## 2018-10-05 DIAGNOSIS — E559 Vitamin D deficiency, unspecified: Secondary | ICD-10-CM | POA: Diagnosis not present

## 2018-10-05 DIAGNOSIS — Z1389 Encounter for screening for other disorder: Secondary | ICD-10-CM | POA: Diagnosis not present

## 2018-10-05 DIAGNOSIS — I10 Essential (primary) hypertension: Secondary | ICD-10-CM | POA: Diagnosis not present

## 2018-10-05 DIAGNOSIS — Z23 Encounter for immunization: Secondary | ICD-10-CM | POA: Diagnosis not present

## 2018-10-13 DIAGNOSIS — M5030 Other cervical disc degeneration, unspecified cervical region: Secondary | ICD-10-CM | POA: Diagnosis not present

## 2018-10-13 DIAGNOSIS — M8589 Other specified disorders of bone density and structure, multiple sites: Secondary | ICD-10-CM | POA: Diagnosis not present

## 2018-10-13 DIAGNOSIS — M150 Primary generalized (osteo)arthritis: Secondary | ICD-10-CM | POA: Diagnosis not present

## 2018-10-13 DIAGNOSIS — Z79899 Other long term (current) drug therapy: Secondary | ICD-10-CM | POA: Diagnosis not present

## 2018-10-13 DIAGNOSIS — M0589 Other rheumatoid arthritis with rheumatoid factor of multiple sites: Secondary | ICD-10-CM | POA: Diagnosis not present

## 2018-10-13 DIAGNOSIS — Z681 Body mass index (BMI) 19 or less, adult: Secondary | ICD-10-CM | POA: Diagnosis not present

## 2018-12-22 DIAGNOSIS — Z1389 Encounter for screening for other disorder: Secondary | ICD-10-CM | POA: Diagnosis not present

## 2018-12-22 DIAGNOSIS — Z6820 Body mass index (BMI) 20.0-20.9, adult: Secondary | ICD-10-CM | POA: Diagnosis not present

## 2018-12-22 DIAGNOSIS — M069 Rheumatoid arthritis, unspecified: Secondary | ICD-10-CM | POA: Diagnosis not present

## 2018-12-22 DIAGNOSIS — I10 Essential (primary) hypertension: Secondary | ICD-10-CM | POA: Diagnosis not present

## 2019-01-21 DIAGNOSIS — M150 Primary generalized (osteo)arthritis: Secondary | ICD-10-CM | POA: Diagnosis not present

## 2019-01-21 DIAGNOSIS — M5030 Other cervical disc degeneration, unspecified cervical region: Secondary | ICD-10-CM | POA: Diagnosis not present

## 2019-01-21 DIAGNOSIS — Z79899 Other long term (current) drug therapy: Secondary | ICD-10-CM | POA: Diagnosis not present

## 2019-01-21 DIAGNOSIS — M0589 Other rheumatoid arthritis with rheumatoid factor of multiple sites: Secondary | ICD-10-CM | POA: Diagnosis not present

## 2019-01-21 DIAGNOSIS — Z681 Body mass index (BMI) 19 or less, adult: Secondary | ICD-10-CM | POA: Diagnosis not present

## 2019-01-21 DIAGNOSIS — M8589 Other specified disorders of bone density and structure, multiple sites: Secondary | ICD-10-CM | POA: Diagnosis not present

## 2019-02-16 DIAGNOSIS — E559 Vitamin D deficiency, unspecified: Secondary | ICD-10-CM | POA: Diagnosis not present

## 2019-02-16 DIAGNOSIS — G5702 Lesion of sciatic nerve, left lower limb: Secondary | ICD-10-CM | POA: Diagnosis not present

## 2019-02-16 DIAGNOSIS — I10 Essential (primary) hypertension: Secondary | ICD-10-CM | POA: Diagnosis not present

## 2019-02-16 DIAGNOSIS — Z6820 Body mass index (BMI) 20.0-20.9, adult: Secondary | ICD-10-CM | POA: Diagnosis not present

## 2019-02-16 DIAGNOSIS — M069 Rheumatoid arthritis, unspecified: Secondary | ICD-10-CM | POA: Diagnosis not present

## 2019-02-16 DIAGNOSIS — R918 Other nonspecific abnormal finding of lung field: Secondary | ICD-10-CM | POA: Diagnosis not present

## 2019-02-17 ENCOUNTER — Other Ambulatory Visit (HOSPITAL_COMMUNITY): Payer: Self-pay | Admitting: Family Medicine

## 2019-02-17 DIAGNOSIS — R918 Other nonspecific abnormal finding of lung field: Secondary | ICD-10-CM

## 2019-03-04 ENCOUNTER — Ambulatory Visit (HOSPITAL_COMMUNITY)
Admission: RE | Admit: 2019-03-04 | Discharge: 2019-03-04 | Disposition: A | Discharge status: Home / Self Care | Payer: Medicare HMO | Source: Ambulatory Visit | Attending: Family Medicine | Admitting: Family Medicine

## 2019-03-04 ENCOUNTER — Other Ambulatory Visit: Payer: Self-pay

## 2019-03-04 DIAGNOSIS — R918 Other nonspecific abnormal finding of lung field: Secondary | ICD-10-CM | POA: Diagnosis not present

## 2019-03-04 LAB — POCT I-STAT CREATININE: CREATININE: 0.7 mg/dL (ref 0.44–1.00)

## 2019-03-04 MED ORDER — IOHEXOL 300 MG/ML  SOLN
75.0000 mL | Freq: Once | INTRAMUSCULAR | Status: AC | PRN
  Administered 2019-03-04: 75 mL via INTRAVENOUS

## 2019-03-09 ENCOUNTER — Encounter: Payer: Medicare HMO | Admitting: Thoracic Surgery (Cardiothoracic Vascular Surgery)

## 2019-03-30 ENCOUNTER — Other Ambulatory Visit (HOSPITAL_COMMUNITY): Payer: Self-pay | Admitting: Family Medicine

## 2019-03-30 DIAGNOSIS — K769 Liver disease, unspecified: Secondary | ICD-10-CM

## 2019-04-21 DIAGNOSIS — Z79899 Other long term (current) drug therapy: Secondary | ICD-10-CM | POA: Diagnosis not present

## 2019-04-21 DIAGNOSIS — M0589 Other rheumatoid arthritis with rheumatoid factor of multiple sites: Secondary | ICD-10-CM | POA: Diagnosis not present

## 2019-05-18 ENCOUNTER — Other Ambulatory Visit (HOSPITAL_COMMUNITY): Payer: Self-pay | Admitting: Family Medicine

## 2019-05-18 ENCOUNTER — Ambulatory Visit (HOSPITAL_COMMUNITY)
Admission: RE | Admit: 2019-05-18 | Discharge: 2019-05-18 | Disposition: A | Discharge status: Home / Self Care | Payer: Medicare HMO | Source: Ambulatory Visit | Attending: Family Medicine | Admitting: Family Medicine

## 2019-05-18 ENCOUNTER — Other Ambulatory Visit: Payer: Self-pay

## 2019-05-18 DIAGNOSIS — Z1231 Encounter for screening mammogram for malignant neoplasm of breast: Secondary | ICD-10-CM

## 2019-05-18 DIAGNOSIS — K5710 Diverticulosis of small intestine without perforation or abscess without bleeding: Secondary | ICD-10-CM | POA: Diagnosis not present

## 2019-05-18 DIAGNOSIS — K769 Liver disease, unspecified: Secondary | ICD-10-CM | POA: Diagnosis not present

## 2019-05-18 LAB — POCT I-STAT CREATININE: Creatinine, Ser: 0.8 mg/dL (ref 0.44–1.00)

## 2019-05-18 MED ORDER — GADOBUTROL 1 MMOL/ML IV SOLN
7.0000 mL | Freq: Once | INTRAVENOUS | Status: AC | PRN
  Administered 2019-05-18: 7 mL via INTRAVENOUS

## 2019-05-31 ENCOUNTER — Other Ambulatory Visit: Payer: Self-pay

## 2019-05-31 ENCOUNTER — Ambulatory Visit (HOSPITAL_COMMUNITY)
Admission: RE | Admit: 2019-05-31 | Discharge: 2019-05-31 | Disposition: A | Discharge status: Home / Self Care | Payer: Medicare HMO | Source: Ambulatory Visit | Attending: Family Medicine | Admitting: Family Medicine

## 2019-05-31 DIAGNOSIS — Z1231 Encounter for screening mammogram for malignant neoplasm of breast: Secondary | ICD-10-CM | POA: Diagnosis not present

## 2019-06-28 DIAGNOSIS — Z7189 Other specified counseling: Secondary | ICD-10-CM | POA: Diagnosis not present

## 2019-06-28 DIAGNOSIS — Z6820 Body mass index (BMI) 20.0-20.9, adult: Secondary | ICD-10-CM | POA: Diagnosis not present

## 2019-06-28 DIAGNOSIS — M0589 Other rheumatoid arthritis with rheumatoid factor of multiple sites: Secondary | ICD-10-CM | POA: Diagnosis not present

## 2019-06-28 DIAGNOSIS — Z7952 Long term (current) use of systemic steroids: Secondary | ICD-10-CM | POA: Diagnosis not present

## 2019-06-28 DIAGNOSIS — M150 Primary generalized (osteo)arthritis: Secondary | ICD-10-CM | POA: Diagnosis not present

## 2019-06-28 DIAGNOSIS — M8589 Other specified disorders of bone density and structure, multiple sites: Secondary | ICD-10-CM | POA: Diagnosis not present

## 2019-06-28 DIAGNOSIS — Z79899 Other long term (current) drug therapy: Secondary | ICD-10-CM | POA: Diagnosis not present

## 2019-06-28 DIAGNOSIS — M5030 Other cervical disc degeneration, unspecified cervical region: Secondary | ICD-10-CM | POA: Diagnosis not present

## 2019-07-16 DIAGNOSIS — H5200 Hypermetropia, unspecified eye: Secondary | ICD-10-CM | POA: Diagnosis not present

## 2019-09-01 DIAGNOSIS — Z0000 Encounter for general adult medical examination without abnormal findings: Secondary | ICD-10-CM | POA: Diagnosis not present

## 2019-09-01 DIAGNOSIS — Z1389 Encounter for screening for other disorder: Secondary | ICD-10-CM | POA: Diagnosis not present

## 2019-09-01 DIAGNOSIS — Z6821 Body mass index (BMI) 21.0-21.9, adult: Secondary | ICD-10-CM | POA: Diagnosis not present

## 2019-09-01 DIAGNOSIS — Z23 Encounter for immunization: Secondary | ICD-10-CM | POA: Diagnosis not present

## 2019-09-01 DIAGNOSIS — I10 Essential (primary) hypertension: Secondary | ICD-10-CM | POA: Diagnosis not present

## 2019-09-02 DIAGNOSIS — R69 Illness, unspecified: Secondary | ICD-10-CM | POA: Diagnosis not present

## 2019-09-21 DIAGNOSIS — Z6821 Body mass index (BMI) 21.0-21.9, adult: Secondary | ICD-10-CM | POA: Diagnosis not present

## 2019-09-21 DIAGNOSIS — Z888 Allergy status to other drugs, medicaments and biological substances status: Secondary | ICD-10-CM | POA: Diagnosis not present

## 2019-09-21 DIAGNOSIS — T50905A Adverse effect of unspecified drugs, medicaments and biological substances, initial encounter: Secondary | ICD-10-CM | POA: Diagnosis not present

## 2019-09-28 DIAGNOSIS — Z79899 Other long term (current) drug therapy: Secondary | ICD-10-CM | POA: Diagnosis not present

## 2019-09-28 DIAGNOSIS — M5030 Other cervical disc degeneration, unspecified cervical region: Secondary | ICD-10-CM | POA: Diagnosis not present

## 2019-09-28 DIAGNOSIS — M0589 Other rheumatoid arthritis with rheumatoid factor of multiple sites: Secondary | ICD-10-CM | POA: Diagnosis not present

## 2019-09-28 DIAGNOSIS — M150 Primary generalized (osteo)arthritis: Secondary | ICD-10-CM | POA: Diagnosis not present

## 2019-09-28 DIAGNOSIS — Z7189 Other specified counseling: Secondary | ICD-10-CM | POA: Diagnosis not present

## 2019-09-28 DIAGNOSIS — Z7952 Long term (current) use of systemic steroids: Secondary | ICD-10-CM | POA: Diagnosis not present

## 2019-09-28 DIAGNOSIS — E559 Vitamin D deficiency, unspecified: Secondary | ICD-10-CM | POA: Diagnosis not present

## 2019-09-28 DIAGNOSIS — M8589 Other specified disorders of bone density and structure, multiple sites: Secondary | ICD-10-CM | POA: Diagnosis not present

## 2019-09-28 DIAGNOSIS — Z6820 Body mass index (BMI) 20.0-20.9, adult: Secondary | ICD-10-CM | POA: Diagnosis not present

## 2019-10-29 DIAGNOSIS — Z6821 Body mass index (BMI) 21.0-21.9, adult: Secondary | ICD-10-CM | POA: Diagnosis not present

## 2019-10-29 DIAGNOSIS — I10 Essential (primary) hypertension: Secondary | ICD-10-CM | POA: Diagnosis not present

## 2019-11-14 IMAGING — CT CT CHEST LUNG CANCER SCREENING LOW DOSE W/O CM
1 series · 10 of 10 positions shown, 13 images · non-contrast
Comparison: 05/05/2017

CLINICAL DATA: Thirty-three pack-year history. Current asymptomatic
smoker.

EXAM:
CT CHEST WITHOUT CONTRAST LOW-DOSE FOR LUNG CANCER SCREENING
TECHNIQUE: Multidetector CT imaging of the chest was performed following the
standard protocol without IV contrast.

[ct lung segmentation data · axial · 0.56mm/px · z∈[+1193,+1193]mm · 10 of 296 frames shown]
[frame 1/296  mediastinal]
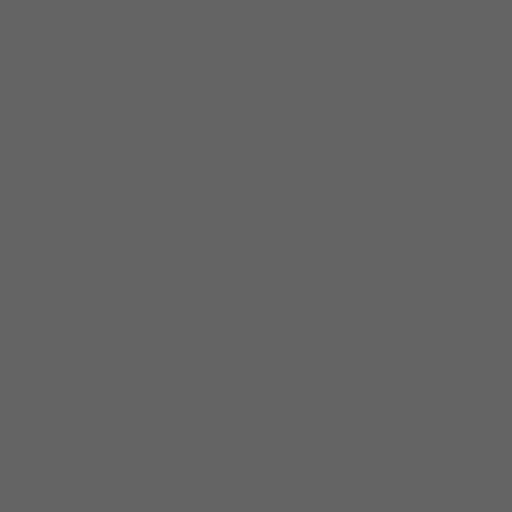
[frame 1/296  lung]
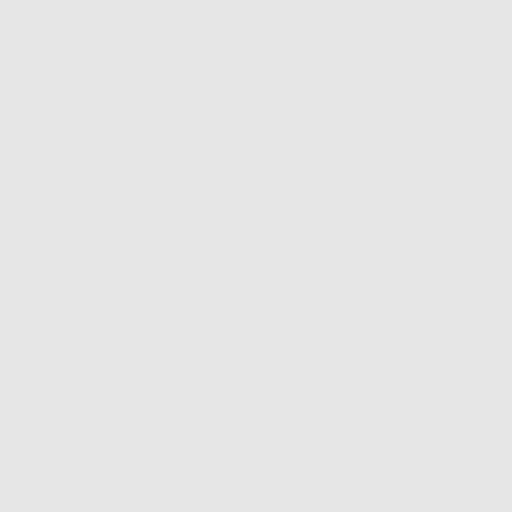
[frame 33/296  lung]
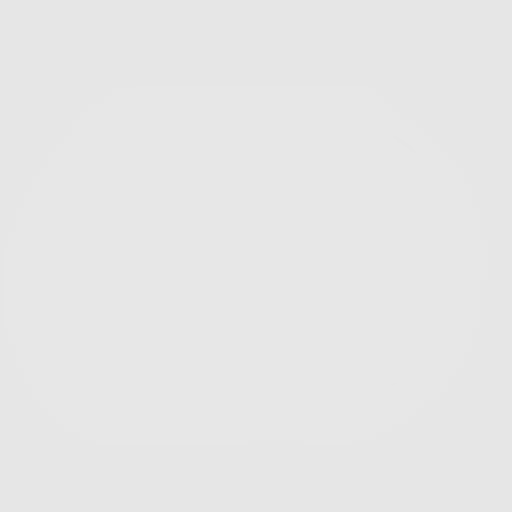
[frame 66/296  lung]
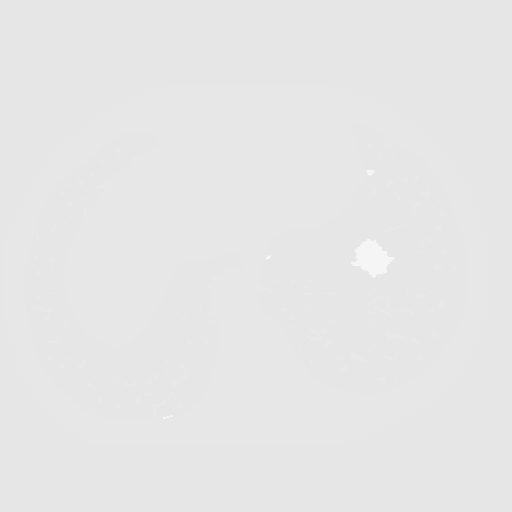
[frame 99/296  lung]
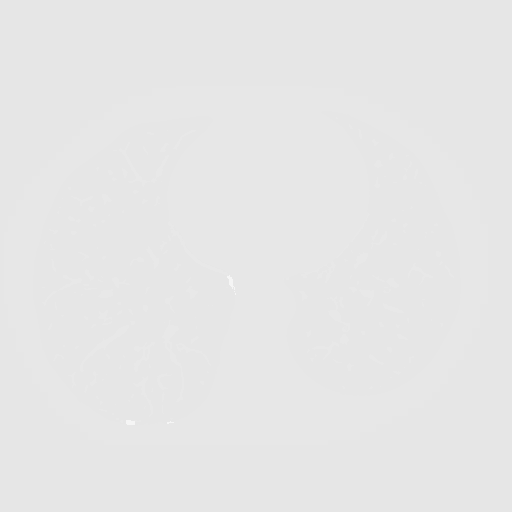
[frame 132/296  mediastinal]
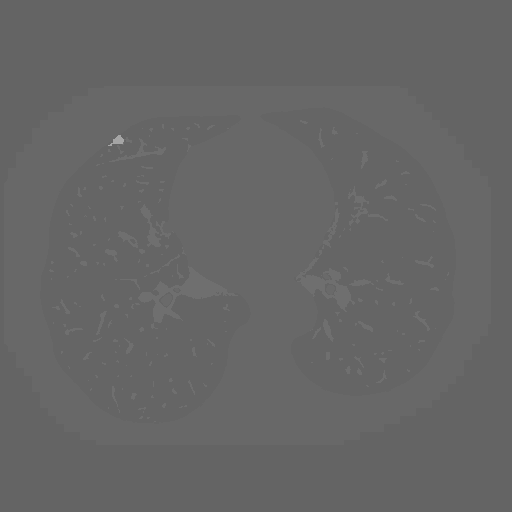
[frame 132/296  lung]
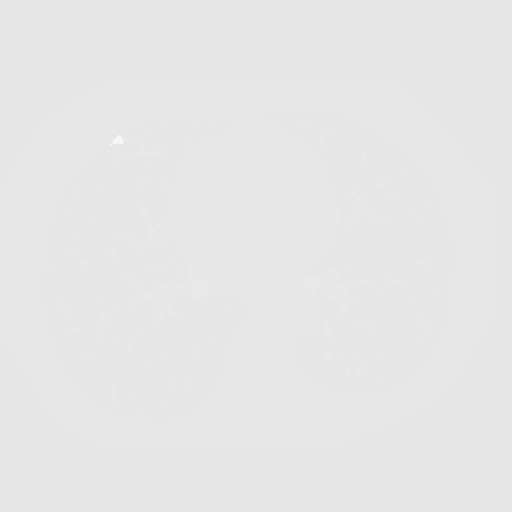
[frame 164/296  lung]
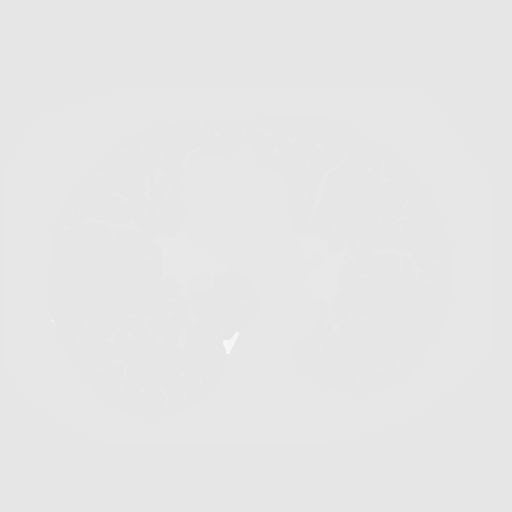
[frame 197/296  lung]
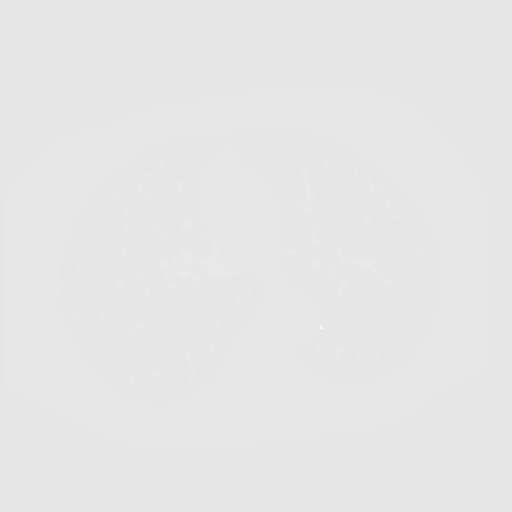
[frame 230/296  lung]
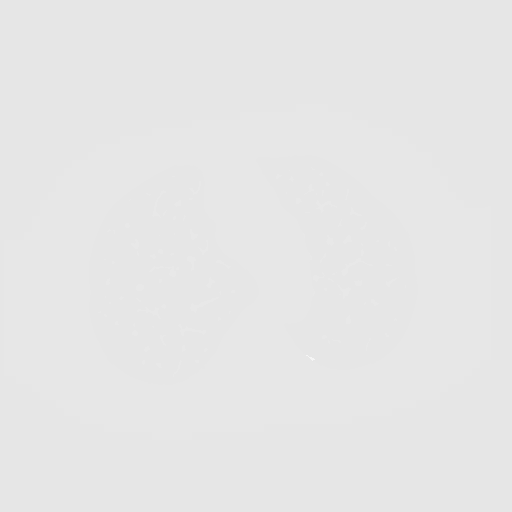
[frame 263/296  mediastinal]
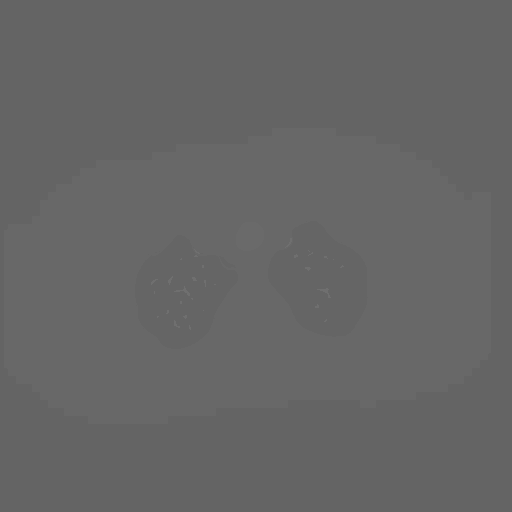
[frame 263/296  lung]
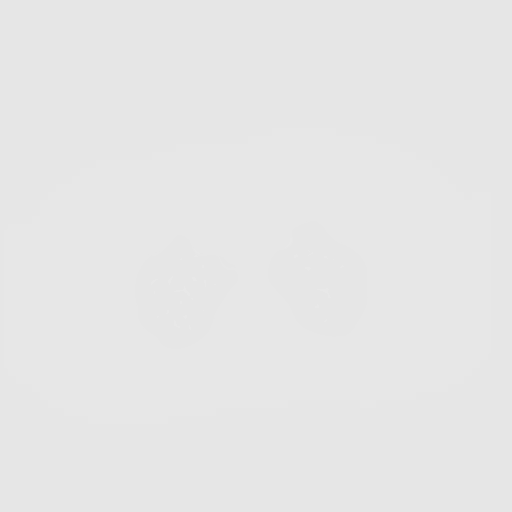
[frame 296/296  lung]
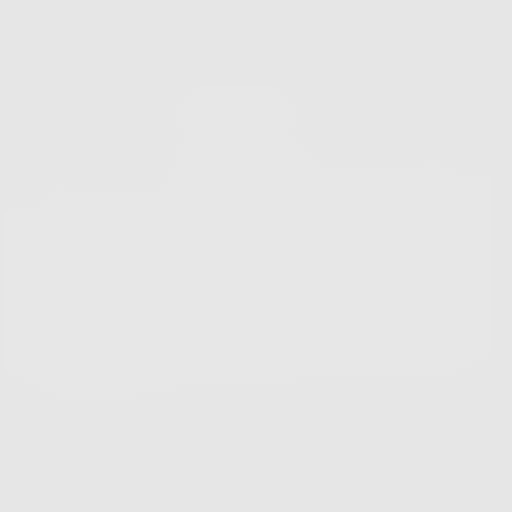

[10 of 10 positions shown; findings below may reference images not displayed]

FINDINGS: Cardiovascular: The heart size appears within normal limits. No
pericardial effusion. Aortic atherosclerosis. Calcification in the
RCA coronary artery and LAD coronary artery noted.

Mediastinum/Nodes: No enlarged mediastinal, hilar, or axillary lymph
nodes. Thyroid gland, trachea, and esophagus demonstrate no
significant findings.

Lungs/Pleura: Mild changes of emphysema with diffuse bronchial wall
thickening. Stable nodule within the superior segment of right lower
lobe with an equivalent diameter of 3.1 mm. There is a new part
solid nodule within the posterior aspect of the right upper lobe
which measures 14 mm with a central solid component of 9 mm.

Upper Abdomen: No acute abnormality.

Musculoskeletal: No chest wall mass or suspicious bone lesions
identified.
IMPRESSION: 1. Lung-RADS 4Bs, suspicious. Additional imaging evaluation or
consultation with Pulmonology or Thoracic Surgery recommended.
2. The S modifier above refers to the postinflammatory/infectious
appearance of the new part solid nodule within the posterior right
upper lobe. Recommend repeat examination in 3 months following a
periphery of therapy and resolution of any symptoms using the CT
chest L CS follow-up exam code.
3. Aortic Atherosclerosis (1YWSI-0LL.L) and Emphysema (1YWSI-FCS.V).
4. RCA and LAD coronary artery calcifications.

## 2019-12-17 DEATH — deceased

## 2020-07-17 IMAGING — CT CT CHEST WITH CONTRAST
2 of 3 series · 15 of 36 positions shown, 18 images · IV contrast (omnipaque)
Comparison: Low-dose lung cancer screening chest CT 07/01/2018.

CLINICAL DATA: 67-year-old female with history of prior abnormal
low-dose lung cancer screening chest CT. Followup study.

EXAM:
CT CHEST WITH CONTRAST
TECHNIQUE: Multidetector CT imaging of the chest was performed during
intravenous contrast administration.
CONTRAST:  75mL OMNIPAQUE IOHEXOL 300 MG/ML  SOLN

[Series 2: axial st · axial · 0.57mm/px · z∈[+779,+1031]mm · 12 of 150 slices shown, 15 images]
[im 12/150  mediastinal]
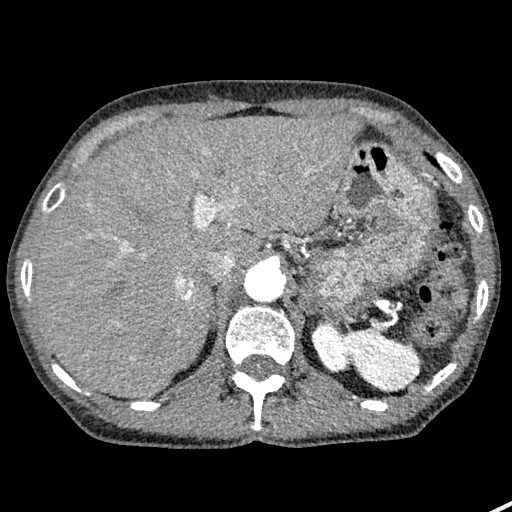
[im 12/150  lung]
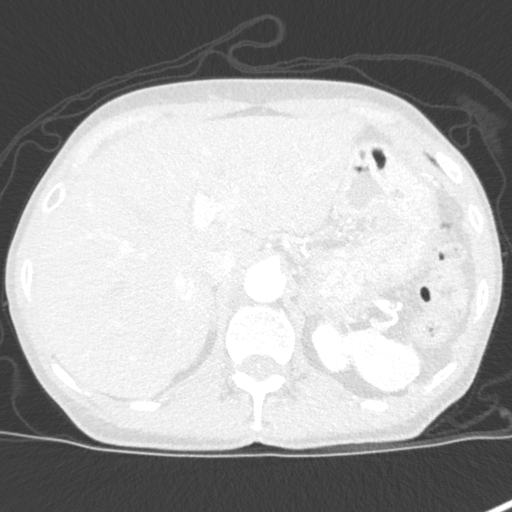
[im 23/150  lung]
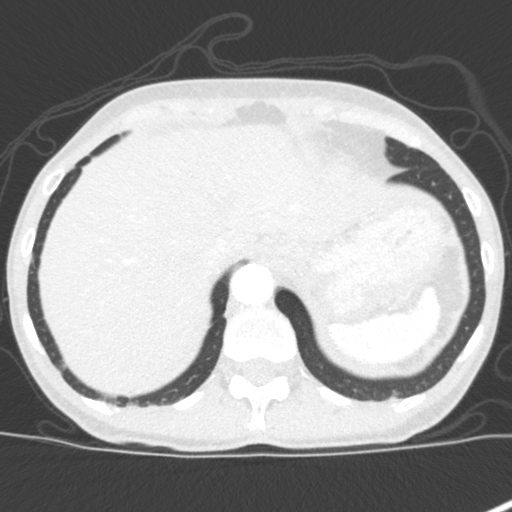
[im 34/150  lung]
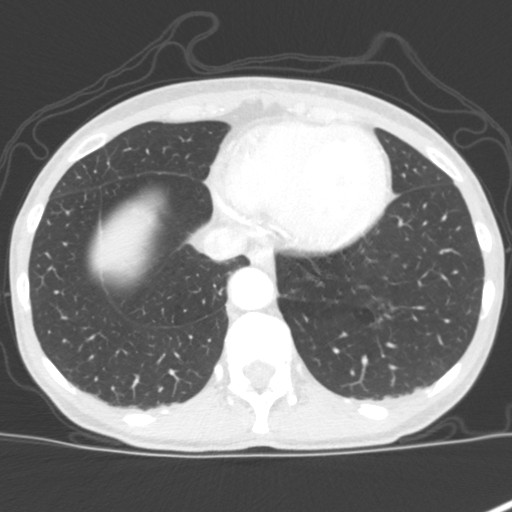
[im 45/150  lung]
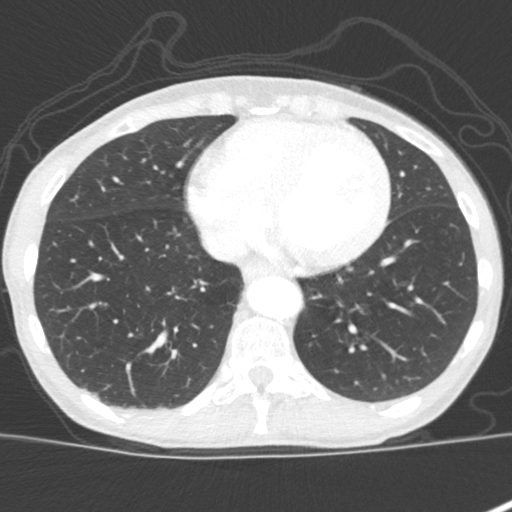
[im 56/150  mediastinal]
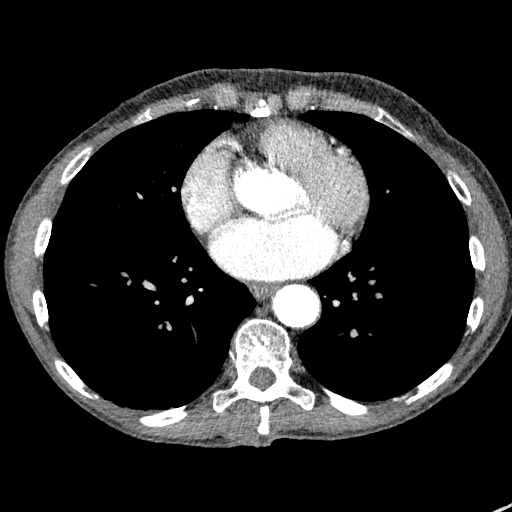
[im 56/150  lung]
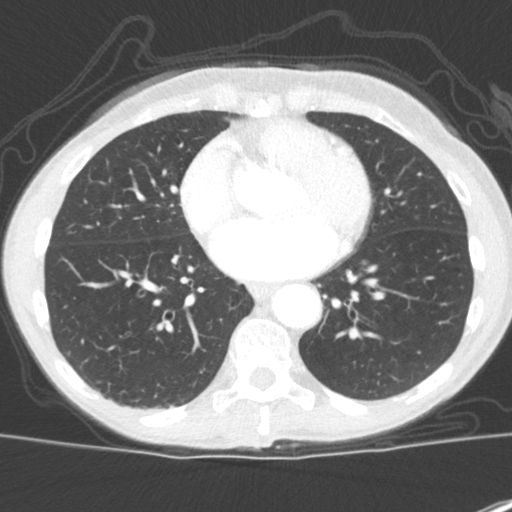
[im 67/150  lung]
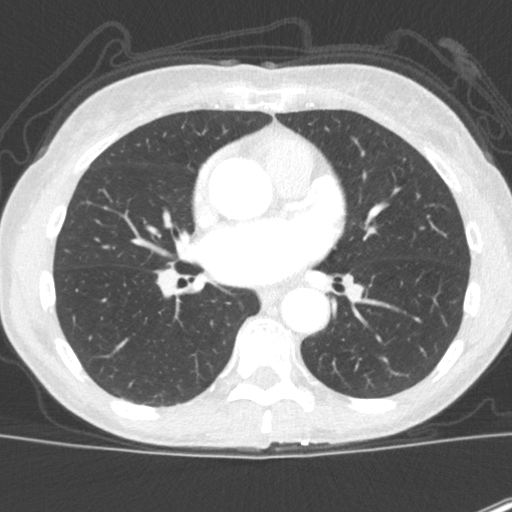
[im 83/150  lung]
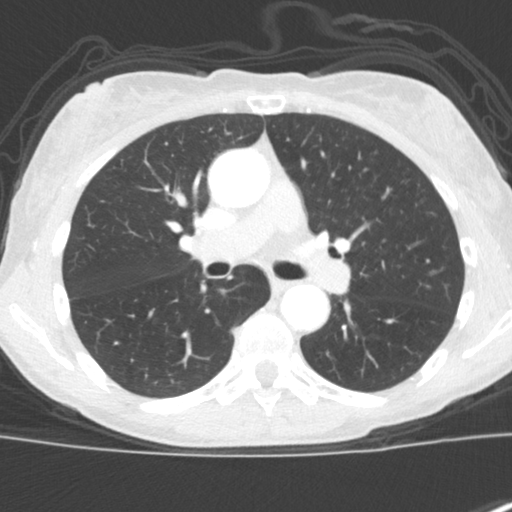
[im 94/150  lung]
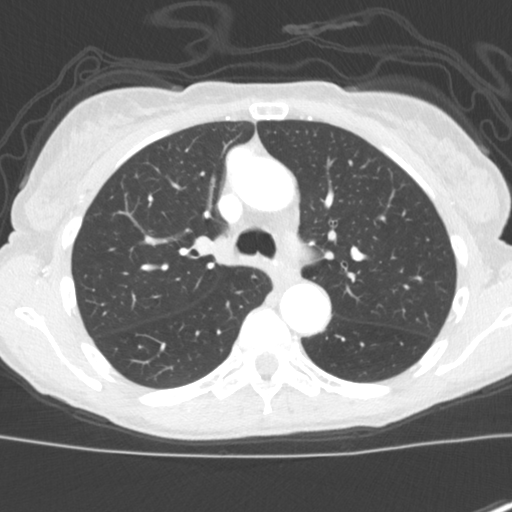
[im 105/150  mediastinal]
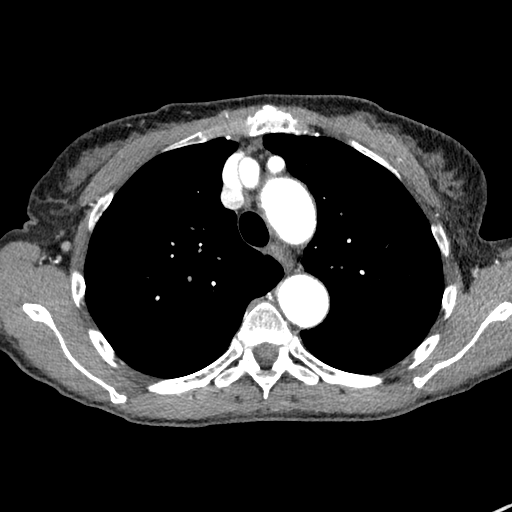
[im 105/150  lung]
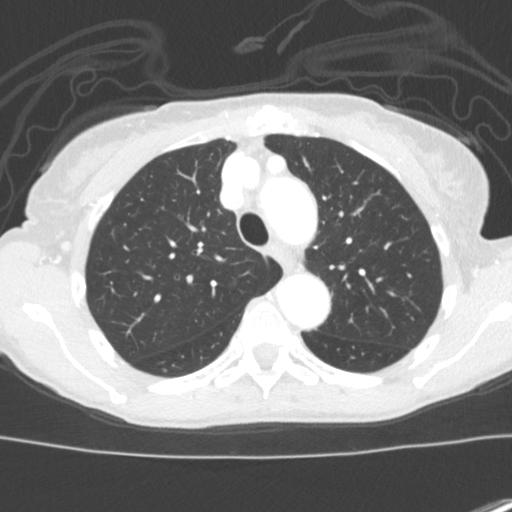
[im 116/150  lung]
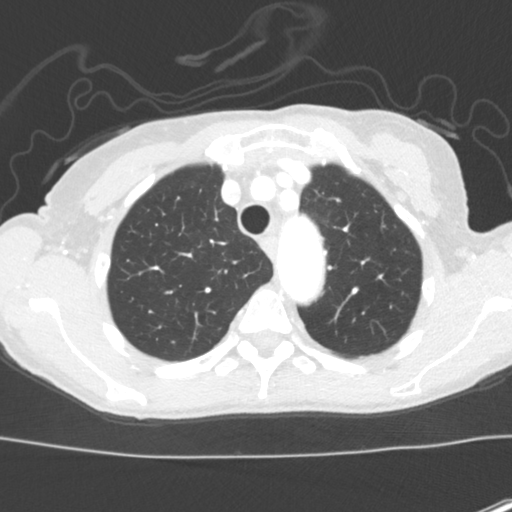
[im 127/150  lung]
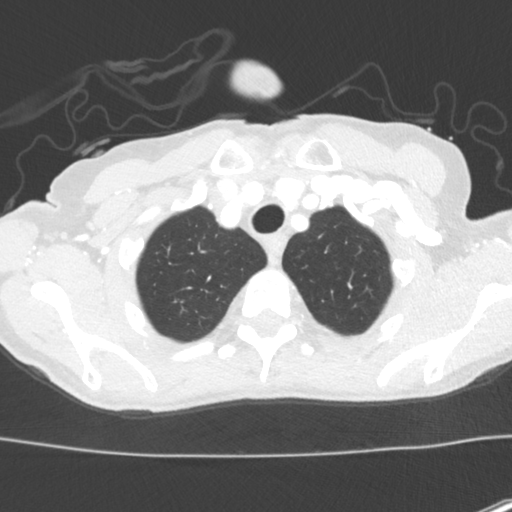
[im 138/150  lung]
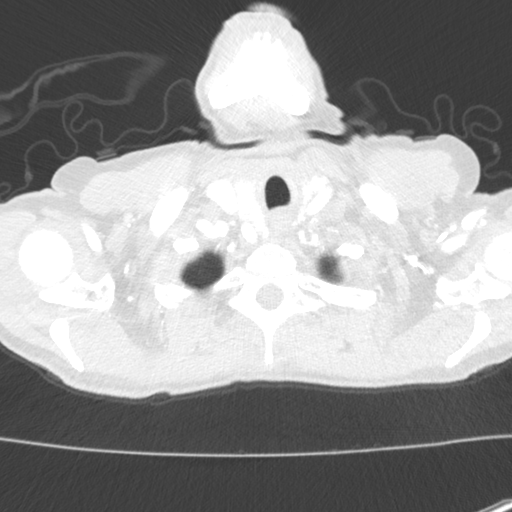

[Series 5: coronal · coronal · 0.58mm/px · 3 of 115 slices shown]
[im 23/115  lung]
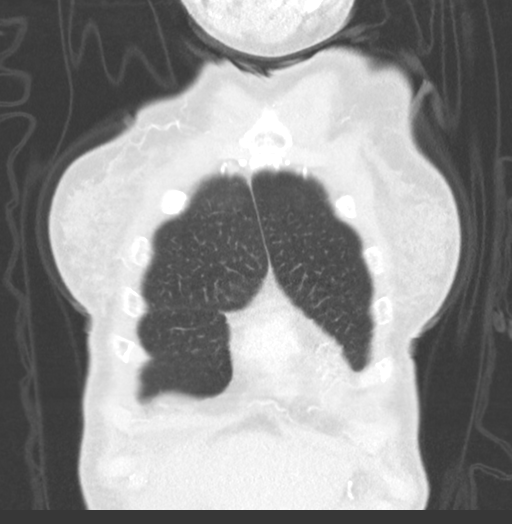
[im 46/115  lung]
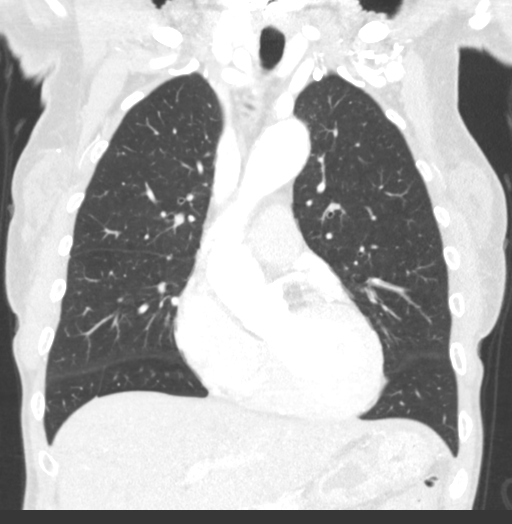
[im 69/115  lung]
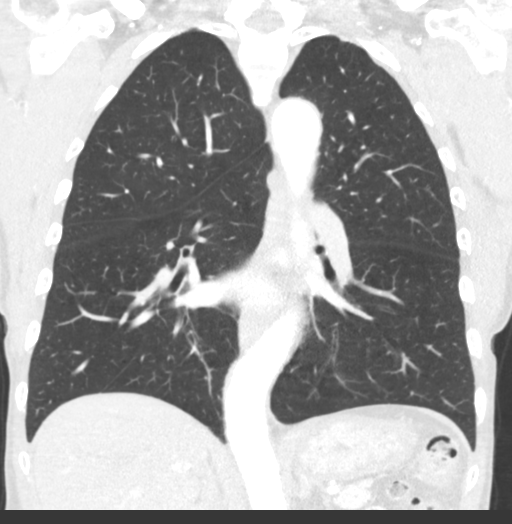

[15 of 36 positions shown; findings below may reference images not displayed]

FINDINGS: Cardiovascular: Heart size is normal. There is no significant
pericardial fluid, thickening or pericardial calcification. Aortic
atherosclerosis. No definite calcifications in the coronary
arteries.

Mediastinum/Nodes: No pathologically enlarged mediastinal or hilar
lymph nodes. Esophagus is unremarkable in appearance. No axillary
lymphadenopathy.

Lungs/Pleura: When compared to the prior examination from
07/01/2018, the nodule of concern in the inferior aspect of the
right upper lobe anteriorly has completely resolved, indicative of a
benign infectious or inflammatory etiology on the prior study. Tiny
3 mm nodule in the superior segment of the right lower lobe (axial
image 47 of series 4), stable compared to prior studies. No other
suspicious appearing pulmonary nodules or masses are noted. No acute
consolidative airspace disease. No pleural effusions.

Upper Abdomen: 7 mm hypervascular lesion in the central aspect of
segment 2 of the liver (axial image 124 of series 2).

Musculoskeletal: Orthopedic fixation hardware in the lower cervical
spine. There are no aggressive appearing lytic or blastic lesions
noted in the visualized portions of the skeleton.
IMPRESSION: 1. Nodule of concern on the prior low-dose lung cancer screening
chest CT has completely resolved, indicative of a benign infectious
or inflammatory etiology on the prior study. Continue annual
screening with low-dose chest CT without contrast in 12 months.
2. 7 mm hypervascular lesion in segment 2 of the liver. This is
incompletely characterized on today's examination. Statistically,
this is likely to represent a small flash fill cavernous hemangioma
or benign perfusion anomaly. If the patient has known cirrhosis or
clinical concern for hepatic malignancy, this could be definitively
characterized with MRI of the abdomen with and without IV gadolinium
if clinically appropriate.
3. Aortic atherosclerosis.

Aortic Atherosclerosis ([id number].C).

## 2023-10-01 ENCOUNTER — Encounter: Payer: Self-pay | Admitting: *Deleted
